# Patient Record
Sex: Female | Born: 1945 | Race: White | Hispanic: No | Marital: Married | Smoking: Never smoker
Health system: Southern US, Community
[De-identification: ages and names within clinical notes are randomized; demographics above are authoritative.]

## PROBLEM LIST (undated history)

## (undated) DIAGNOSIS — M722 Plantar fascial fibromatosis: Secondary | ICD-10-CM

## (undated) DIAGNOSIS — D369 Benign neoplasm, unspecified site: Secondary | ICD-10-CM

## (undated) DIAGNOSIS — R142 Eructation: Secondary | ICD-10-CM

## (undated) DIAGNOSIS — R030 Elevated blood-pressure reading, without diagnosis of hypertension: Secondary | ICD-10-CM

## (undated) DIAGNOSIS — R7301 Impaired fasting glucose: Secondary | ICD-10-CM

## (undated) DIAGNOSIS — IMO0002 Reserved for concepts with insufficient information to code with codable children: Secondary | ICD-10-CM

## (undated) DIAGNOSIS — I10 Essential (primary) hypertension: Secondary | ICD-10-CM

## (undated) DIAGNOSIS — M545 Low back pain, unspecified: Secondary | ICD-10-CM

## (undated) DIAGNOSIS — R000 Tachycardia, unspecified: Secondary | ICD-10-CM

## (undated) DIAGNOSIS — R0982 Postnasal drip: Secondary | ICD-10-CM

## (undated) DIAGNOSIS — Z86010 Personal history of colonic polyps: Secondary | ICD-10-CM

## (undated) HISTORY — DX: Eructation: R14.2

## (undated) HISTORY — DX: Postnasal drip: R09.82

## (undated) HISTORY — DX: Impaired fasting glucose: R73.01

## (undated) HISTORY — DX: Plantar fascial fibromatosis: M72.2

## (undated) HISTORY — DX: Tachycardia, unspecified: R00.0

## (undated) HISTORY — DX: Low back pain: M54.5

## (undated) HISTORY — DX: Benign neoplasm, unspecified site: D36.9

## (undated) HISTORY — DX: Low back pain, unspecified: M54.50

## (undated) HISTORY — DX: Essential (primary) hypertension: I10

## (undated) HISTORY — DX: Elevated blood-pressure reading, without diagnosis of hypertension: R03.0

## (undated) HISTORY — PX: NASAL SEPTUM SURGERY: SHX37

## (undated) HISTORY — DX: Personal history of colonic polyps: Z86.010

## (undated) HISTORY — DX: Reserved for concepts with insufficient information to code with codable children: IMO0002

---

## 1998-12-19 ENCOUNTER — Other Ambulatory Visit: Admission: RE | Admit: 1998-12-19 | Discharge: 1998-12-19 | Payer: Self-pay | Admitting: Obstetrics and Gynecology

## 1999-06-26 ENCOUNTER — Encounter: Admission: RE | Admit: 1999-06-26 | Discharge: 1999-06-26 | Payer: Self-pay | Admitting: Family Medicine

## 1999-06-26 ENCOUNTER — Encounter: Payer: Self-pay | Admitting: Family Medicine

## 1999-12-10 ENCOUNTER — Encounter: Admission: RE | Admit: 1999-12-10 | Discharge: 1999-12-10 | Payer: Self-pay | Admitting: Gynecology

## 1999-12-10 ENCOUNTER — Encounter: Payer: Self-pay | Admitting: Gynecology

## 2000-01-30 ENCOUNTER — Other Ambulatory Visit: Admission: RE | Admit: 2000-01-30 | Discharge: 2000-01-30 | Payer: Self-pay | Admitting: Gynecology

## 2000-08-27 ENCOUNTER — Other Ambulatory Visit: Admission: RE | Admit: 2000-08-27 | Discharge: 2000-08-27 | Payer: Self-pay | Admitting: Gynecology

## 2000-08-27 ENCOUNTER — Encounter (INDEPENDENT_AMBULATORY_CARE_PROVIDER_SITE_OTHER): Payer: Self-pay

## 2000-12-09 ENCOUNTER — Encounter: Admission: RE | Admit: 2000-12-09 | Discharge: 2000-12-09 | Payer: Self-pay | Admitting: Gynecology

## 2000-12-09 ENCOUNTER — Encounter: Payer: Self-pay | Admitting: Gynecology

## 2001-01-02 ENCOUNTER — Encounter: Payer: Self-pay | Admitting: Family Medicine

## 2001-01-02 ENCOUNTER — Encounter: Admission: RE | Admit: 2001-01-02 | Discharge: 2001-01-02 | Payer: Self-pay | Admitting: Family Medicine

## 2001-01-12 ENCOUNTER — Other Ambulatory Visit: Admission: RE | Admit: 2001-01-12 | Discharge: 2001-01-12 | Payer: Self-pay | Admitting: Gynecology

## 2001-05-21 ENCOUNTER — Encounter: Payer: Self-pay | Admitting: Interventional Cardiology

## 2001-05-21 ENCOUNTER — Inpatient Hospital Stay (HOSPITAL_COMMUNITY): Admission: EM | Admit: 2001-05-21 | Discharge: 2001-05-22 | Payer: Self-pay | Admitting: *Deleted

## 2001-05-22 ENCOUNTER — Encounter: Payer: Self-pay | Admitting: Interventional Cardiology

## 2001-12-11 ENCOUNTER — Encounter: Payer: Self-pay | Admitting: Family Medicine

## 2001-12-11 ENCOUNTER — Encounter: Admission: RE | Admit: 2001-12-11 | Discharge: 2001-12-11 | Payer: Self-pay | Admitting: Family Medicine

## 2002-11-03 ENCOUNTER — Other Ambulatory Visit: Admission: RE | Admit: 2002-11-03 | Discharge: 2002-11-03 | Payer: Self-pay | Admitting: Gynecology

## 2002-12-13 ENCOUNTER — Encounter: Payer: Self-pay | Admitting: Family Medicine

## 2002-12-13 ENCOUNTER — Encounter: Admission: RE | Admit: 2002-12-13 | Discharge: 2002-12-13 | Payer: Self-pay | Admitting: Family Medicine

## 2003-01-05 ENCOUNTER — Other Ambulatory Visit: Admission: RE | Admit: 2003-01-05 | Discharge: 2003-01-05 | Payer: Self-pay | Admitting: Gynecology

## 2003-12-14 ENCOUNTER — Encounter: Admission: RE | Admit: 2003-12-14 | Discharge: 2003-12-14 | Payer: Self-pay | Admitting: Gynecology

## 2004-01-03 ENCOUNTER — Other Ambulatory Visit: Admission: RE | Admit: 2004-01-03 | Discharge: 2004-01-03 | Payer: Self-pay | Admitting: Gynecology

## 2005-02-01 ENCOUNTER — Other Ambulatory Visit: Admission: RE | Admit: 2005-02-01 | Discharge: 2005-02-01 | Payer: Self-pay | Admitting: Gynecology

## 2005-02-18 ENCOUNTER — Encounter: Admission: RE | Admit: 2005-02-18 | Discharge: 2005-02-18 | Payer: Self-pay | Admitting: Gynecology

## 2006-02-19 ENCOUNTER — Other Ambulatory Visit: Admission: RE | Admit: 2006-02-19 | Discharge: 2006-02-19 | Payer: Self-pay | Admitting: Gynecology

## 2006-02-20 ENCOUNTER — Encounter: Admission: RE | Admit: 2006-02-20 | Discharge: 2006-02-20 | Payer: Self-pay | Admitting: Gynecology

## 2007-02-27 ENCOUNTER — Other Ambulatory Visit: Admission: RE | Admit: 2007-02-27 | Discharge: 2007-02-27 | Payer: Self-pay | Admitting: Gynecology

## 2007-03-11 ENCOUNTER — Encounter: Admission: RE | Admit: 2007-03-11 | Discharge: 2007-03-11 | Payer: Self-pay | Admitting: Family Medicine

## 2008-01-14 ENCOUNTER — Ambulatory Visit: Payer: Self-pay | Admitting: Gynecology

## 2008-03-14 ENCOUNTER — Ambulatory Visit: Payer: Self-pay | Admitting: Gynecology

## 2008-03-14 ENCOUNTER — Other Ambulatory Visit: Admission: RE | Admit: 2008-03-14 | Discharge: 2008-03-14 | Payer: Self-pay | Admitting: Gynecology

## 2008-03-15 ENCOUNTER — Encounter: Admission: RE | Admit: 2008-03-15 | Discharge: 2008-03-15 | Payer: Self-pay | Admitting: Family Medicine

## 2008-07-26 ENCOUNTER — Ambulatory Visit: Payer: Self-pay | Admitting: Gynecology

## 2009-03-30 ENCOUNTER — Encounter: Admission: RE | Admit: 2009-03-30 | Discharge: 2009-03-30 | Payer: Self-pay | Admitting: Gynecology

## 2009-03-30 IMAGING — MG MM DIGITAL SCREENING
4 series · 4 of 4 positions shown · non-contrast
Comparison: none

DG SCREEN MAMMOGRAM BILATERAL
Bilateral CC and MLO view(s) were taken.

DIGITAL SCREENING MAMMOGRAM WITH CAD:
The breast tissue is heterogeneously dense.  No masses or malignant type calcifications are 
identified.  Compared with prior studies.
Images were processed with CAD.

[R CC]
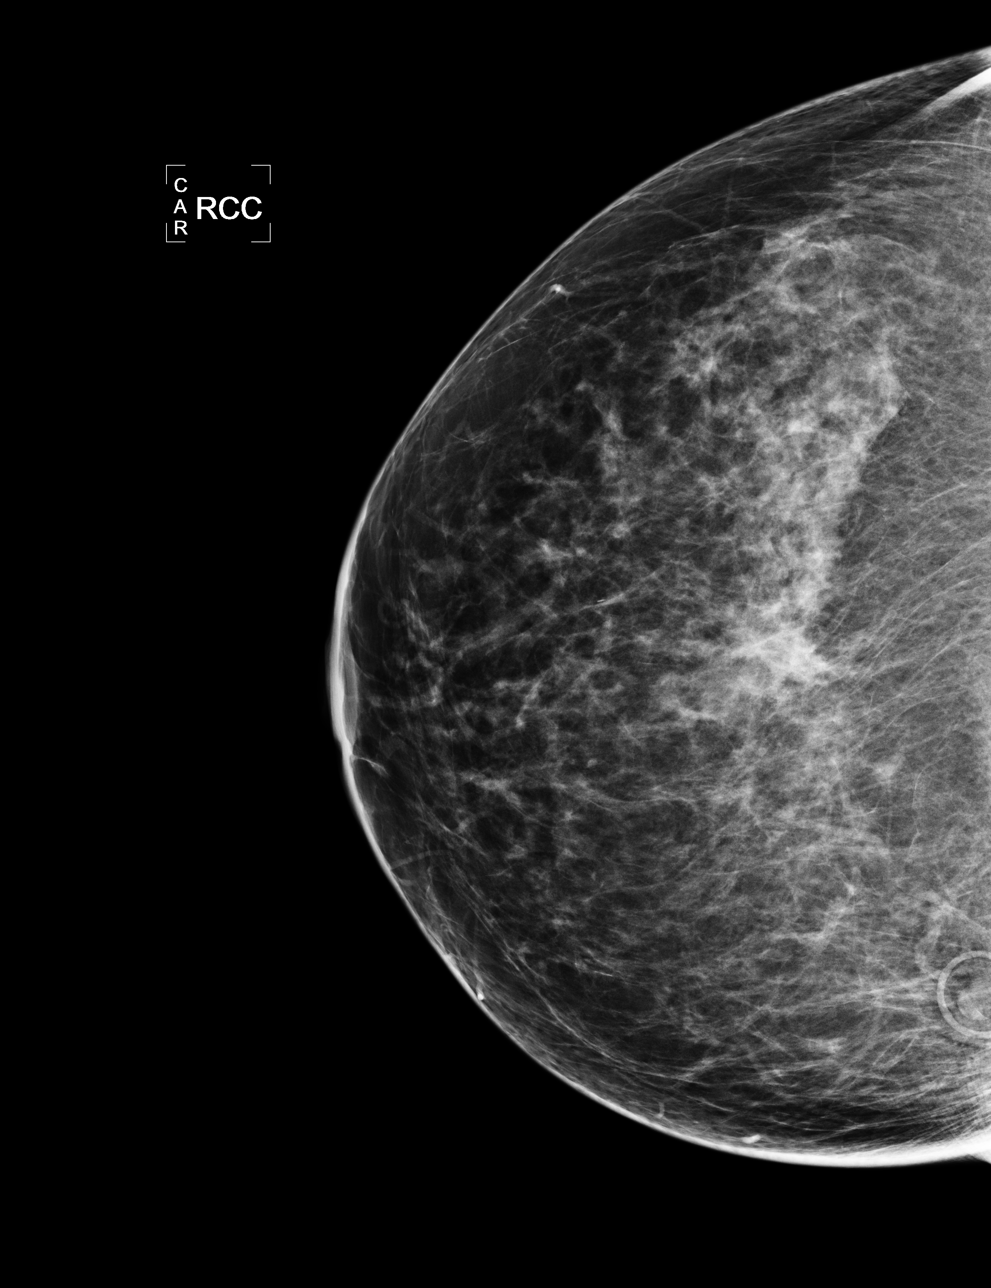

[L CC]
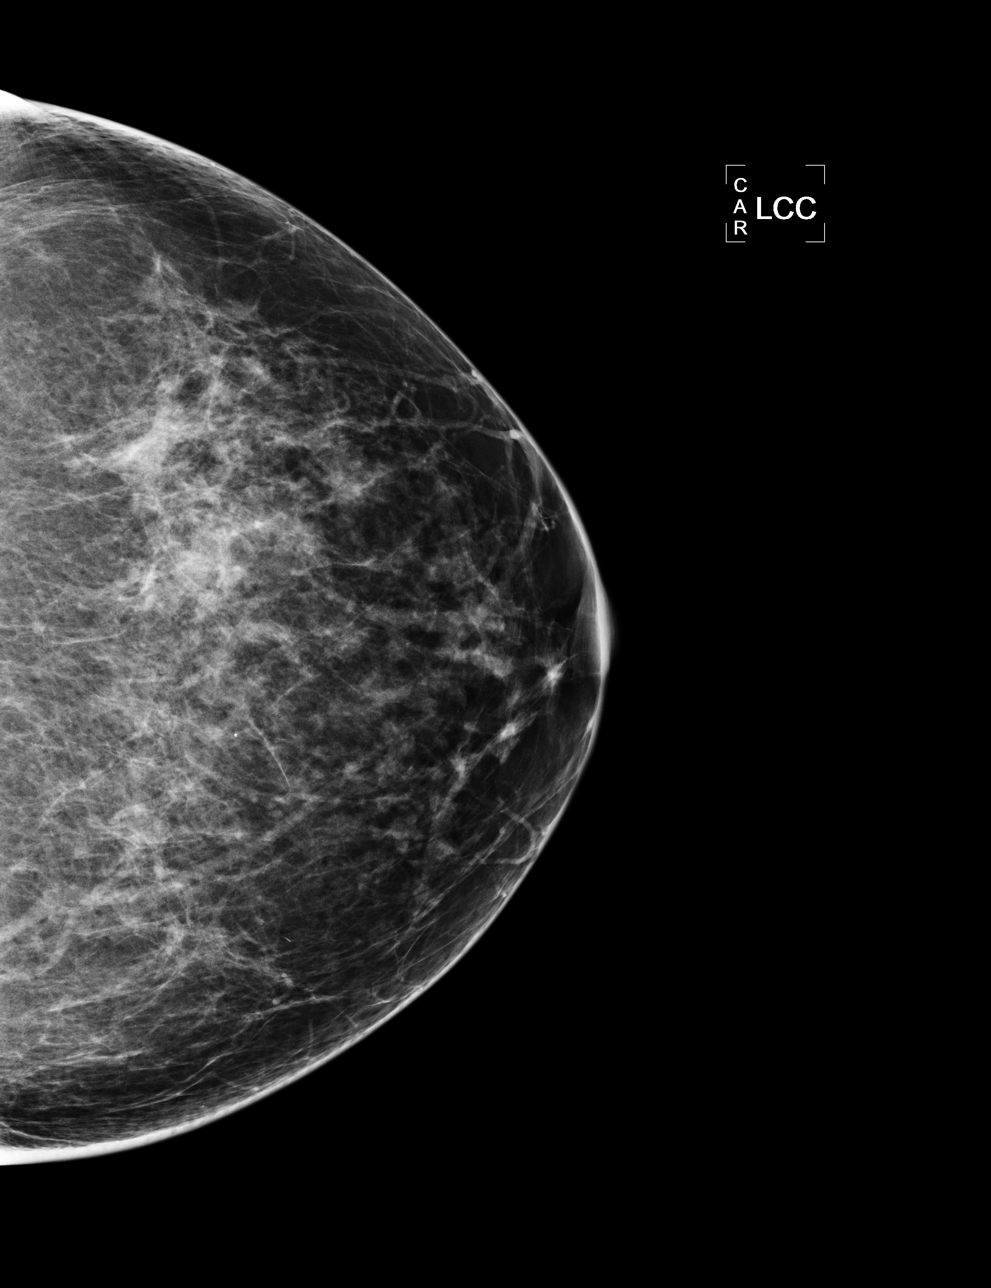

[L MLO]
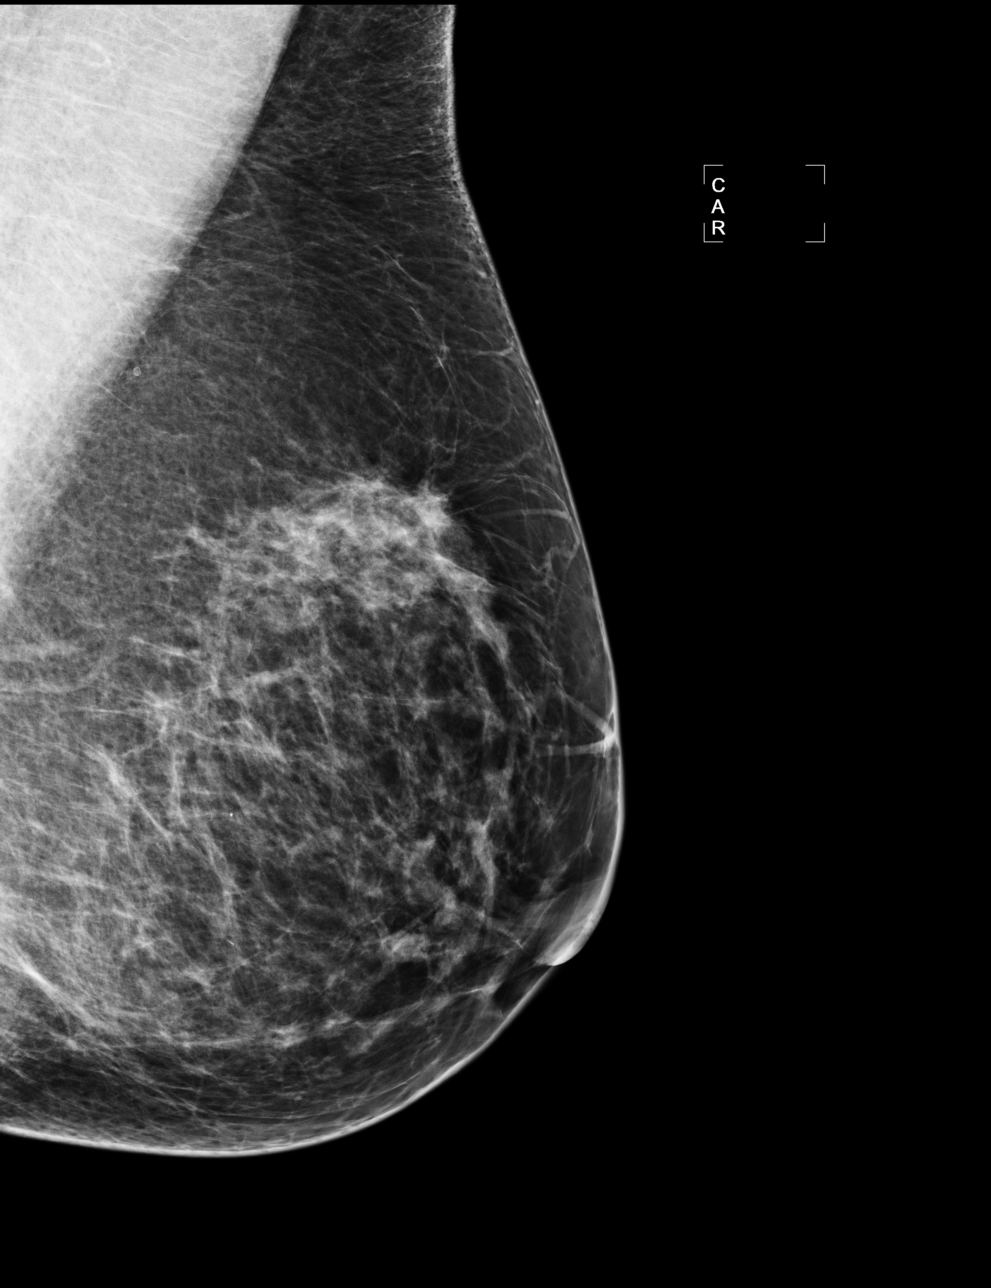

[R MLO]
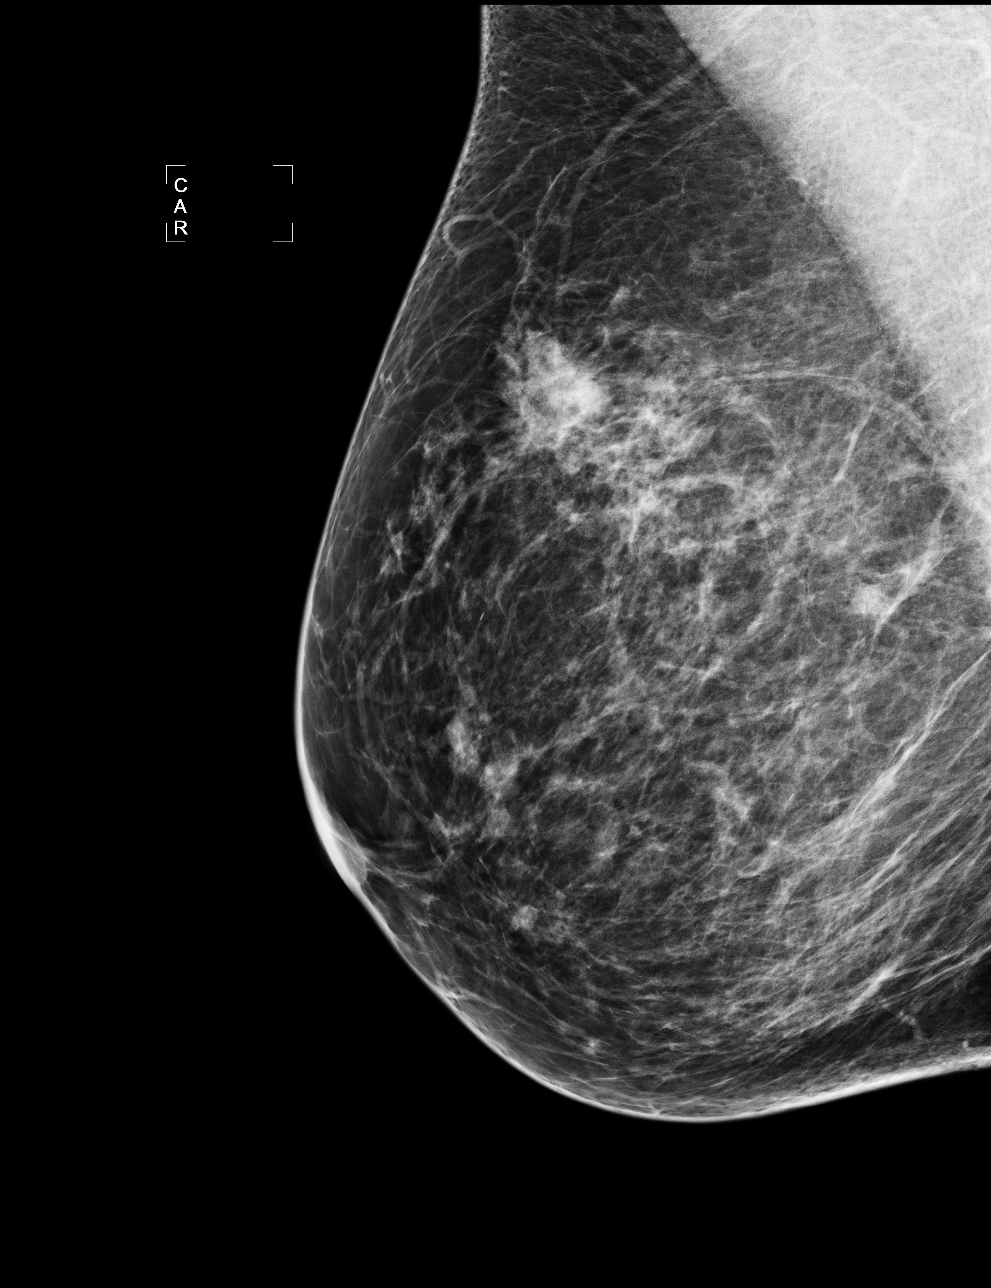

[4 of 4 positions shown; findings below may reference images not displayed]

IMPRESSION: No specific mammographic evidence of malignancy.  Next screening mammogram is recommended in one 
year.

A result letter of this screening mammogram will be mailed directly to the patient.

ASSESSMENT: Negative - BI-RADS 1

Screening mammogram in 1 year.
,

## 2010-04-02 ENCOUNTER — Encounter
Admission: RE | Admit: 2010-04-02 | Discharge: 2010-04-02 | Payer: Self-pay | Source: Home / Self Care | Attending: Family Medicine | Admitting: Family Medicine

## 2010-06-11 ENCOUNTER — Encounter (INDEPENDENT_AMBULATORY_CARE_PROVIDER_SITE_OTHER): Payer: Medicare Other | Admitting: Gynecology

## 2010-06-11 ENCOUNTER — Other Ambulatory Visit (HOSPITAL_COMMUNITY)
Admission: RE | Admit: 2010-06-11 | Discharge: 2010-06-11 | Disposition: A | Discharge status: Home / Self Care | Payer: Medicare Other | Source: Ambulatory Visit | Attending: Gynecology | Admitting: Gynecology

## 2010-06-11 ENCOUNTER — Other Ambulatory Visit: Payer: Self-pay | Admitting: Gynecology

## 2010-06-11 DIAGNOSIS — N952 Postmenopausal atrophic vaginitis: Secondary | ICD-10-CM

## 2010-06-11 DIAGNOSIS — N3941 Urge incontinence: Secondary | ICD-10-CM

## 2010-06-11 DIAGNOSIS — Z124 Encounter for screening for malignant neoplasm of cervix: Secondary | ICD-10-CM | POA: Insufficient documentation

## 2010-06-11 DIAGNOSIS — N951 Menopausal and female climacteric states: Secondary | ICD-10-CM

## 2010-08-10 NOTE — H&P (Signed)
[place]. [facility]  Patient:    Amy [name], Amy [name] Visit Number: [id number] MRN: [id number]          Service Type: MED Location: [id number] Attending Physician:  Corlis Leak. Dictated by:Anselm Lis, N.P. Admit Date:  05/21/2001   CC:         Chales Salmon. Abigail Miyamoto, M.D.                         History and Physical  PRIMARY CARE Kimberley Dastrup:  Chales Salmon. Abigail Miyamoto, M.D. DATE OF BIRTH:  April 26, 1945.  IMPRESSION (as dictated byDr. Verdis Prime): 1. Lower substernal chest pressure, question angina versus gastroesophageal    reflux disease in this 65 year old female with positive family history of    coronary artery disease (late in life), unknown cholesterol profile. 2. Resting tachycardia; heart rate 90s consistently.  PLAN (as dictated by Dr. Verdis Prime): 1. Admit to telemetry onrule out MI protocol, serial cardiac enzymes, daily    EKG. 2. Enteric-coated aspirin q.d., p.r.n. sublingual nitrates. 3. Protonix. 4. If rules out, stress Cardiolite in the morning looking for evidence of    myocardial ischemia.  If rules in, would proceed with cardiac    catheterization with addition of Plavix and/or IIb/IIIa inhibitors as well    as Lovenox or heparin.  IV nitrates if recurrent chest pain. 5. Monitor heart rateovernight.  If she continues tachycardic, might benefit    from beta blocker. 6. Check TSH.  HISTORY OF PRESENT ILLNESS:  Ms. Amy [name] is a very pleasant 65 year old female with a positive family history for late in life CAD in a patient with unknown cholesterol profile.  Last evening about 10 oclock she had left elbow circumferential discomfort, which was mild.  She went to sleep with the same discomfort.  In the morning she woke with continued left elbow discomfort and later developed left shoulder discomfort, which lasted about 30 minutes.  No associated symptoms such as nausea, diaphoresis, or shortness of breath.  She took some  Goodys Powder with resolution of elbow and shoulder discomfort.  She had drunk a Pepsi and a few crackers earlier.  She then developed lower substernal chest pressure, no associated symptoms nor recurrence of arm and shoulder pain.  She presented to primary M.D. office, where EKG was nonischemic.  EMS was summoned.  She was given one sublingual nitrate, which tended to ease the pressure.  She is currently discomfort-free.  Follow-up EKG revealed some T-wave inversion in lead III here in the ER with small Q-wave.  Question unstable angina versus GERD.  PAST MEDICAL HISTORY: 1. Problems with flatus. 2. Urinary stress incontinence.  She is due tosee Dr. Aldean Ast in follow-up    of this. 3. History of repair of deviated nasal septum. 4. Degenerative disk disease, lower back, for which she takes daily Vioxx.  She denies history of diabetes mellitus, cancer, asthma, nor thyroid disease.  ALLERGIES:  No known drug allergies.She broke out at one time when eating scallops but has since eaten them without problems.  MEDICATIONS:  Vioxx once daily, multivitamin a day,she takes Women of Soy products for menopausal symptoms with some relief, also takes B complex vitamin as well.  SOCIAL HISTORY:  Tobacco:  Negative.  ETOH:  Negative.  She works a very stressful job in [employer].  She is married for 37 years, has two daughters, ages 21 and 66, alive and well, one of whom is a  pharmacist.  FAMILY HISTORY:  Herfather had bypass surgery at age 47s.  He is currently 71.  Mother had history of cardiac catheterization in her 44s, for which she is undergoingmedical therapy.  She is also 83.  The patient has one brother who is alive and well.  REVIEW OF SYSTEMS:  She wears bifocals.  Hearing okay.  Negative dysphagia of food or fluids.  Negative melena, no bright red blood p.r.  No constipation or diarrhea.  She suffers from stress incontinence, for which she plans to see Dr. Aldean Ast.  She  feels palpitations of stress.  Lower back pains, for which she takes Vioxx for degenerative disk disease.  Negative pedal edema, orthopnea, PND, nor DOE.  She is fairly sedentary.  PHYSICAL EXAMINATION (as performed by Dr. Verdis Prime):  VITAL SIGNS:  Blood pressure is 153/71, heart rate 96 and regular, respiratory rate 16.  Her temperature is 98 degrees.  GENERAL:She is a well-nourished, slightly anxious middle-aged female in no acutedistress.  Her husband and daughter as well as a little granddaughter are in attendance.  NECK:  Brisk bilateral carotid upstroke without bruit.  Negative for any JVD. No thyromegaly.  CARDIAC:  Regular rateand rhythm without murmur, rub, nor gallop. Normal S1 and S2.  CHEST:  Clear.  No CPA tenderness.  ABDOMEN:  Soft, nondistended, normoactive bowel sounds.  Negative abdominal aortic, inguinal, or femoral bruit.  No masses nor organomegaly to applied pressure.  Nontender.  EXTREMITIES:  Distal pulses intact.  Negative pedal edema.  NEUROLOGIC:  Cranial nerves II-XII grossly intact, alert and oriented x 3.  GENITAL/RECTAL:  Deferred.  LABORATORY DATA:  CBC, CMET, coagulation studies, TSH, cardiac enzymes are drawn and are pending.  EKG reveals NSR at 96 beats per minute, T-wave inversion in lead III.  No ischemic ST changes.  Chest x-ray revealed no active disease. Dictated by:   MaryP. Serpe, N.P. Attending Physician:  Corlis Leak DD:  05/21/01 TD:  05/21/01 Job: [id number]

## 2011-01-17 ENCOUNTER — Emergency Department (HOSPITAL_COMMUNITY): Payer: Medicare Other

## 2011-01-17 ENCOUNTER — Observation Stay (HOSPITAL_COMMUNITY)
Admission: EM | Admit: 2011-01-17 | Discharge: 2011-01-18 | Disposition: A | Discharge status: Home / Self Care | Payer: Medicare Other | Attending: Internal Medicine | Admitting: Internal Medicine

## 2011-01-17 DIAGNOSIS — R0602 Shortness of breath: Secondary | ICD-10-CM | POA: Insufficient documentation

## 2011-01-17 DIAGNOSIS — R0989 Other specified symptoms and signs involving the circulatory and respiratory systems: Secondary | ICD-10-CM | POA: Insufficient documentation

## 2011-01-17 DIAGNOSIS — R0609 Other forms of dyspnea: Secondary | ICD-10-CM | POA: Insufficient documentation

## 2011-01-17 DIAGNOSIS — Z8249 Family history of ischemic heart disease and other diseases of the circulatory system: Secondary | ICD-10-CM | POA: Insufficient documentation

## 2011-01-17 DIAGNOSIS — R079 Chest pain, unspecified: Principal | ICD-10-CM | POA: Insufficient documentation

## 2011-01-17 LAB — DIFFERENTIAL
Eosinophils Absolute: 0 10*3/uL (ref 0.0–0.7)
Lymphocytes Relative: 32 % (ref 12–46)
Lymphs Abs: 1.4 10*3/uL (ref 0.7–4.0)
Monocytes Relative: 10 % (ref 3–12)
Neutrophils Relative %: 56 % (ref 43–77)

## 2011-01-17 LAB — COMPREHENSIVE METABOLIC PANEL
AST: 24 U/L (ref 0–37)
BUN: 15 mg/dL (ref 6–23)
CO2: 28 mEq/L (ref 19–32)
Calcium: 9.8 mg/dL (ref 8.4–10.5)
Chloride: 104 mEq/L (ref 96–112)
Creatinine, Ser: 0.71 mg/dL (ref 0.50–1.10)
GFR calc Af Amer: >90 mL/min (ref >90)
GFR calc non Af Amer: 89 mL/min — ABNORMAL LOW (ref >90)
Glucose, Bld: 96 mg/dL (ref 70–99)
Total Bilirubin: 0.4 mg/dL (ref 0.3–1.2)

## 2011-01-17 LAB — POCT I-STAT TROPONIN I: Troponin i, poc: 0 ng/mL (ref 0.00–0.08)

## 2011-01-17 LAB — CBC
HCT: 38.4 % (ref 36.0–46.0)
MCH: 33.3 pg (ref 26.0–34.0)
MCV: 96 fL (ref 78.0–100.0)
Platelets: 241 10*3/uL (ref 150–400)
RBC: 4 MIL/uL (ref 3.87–5.11)
WBC: 4.4 10*3/uL (ref 4.0–10.5)

## 2011-01-17 LAB — PRO B NATRIURETIC PEPTIDE: Pro B Natriuretic peptide (BNP): 53.1 pg/mL (ref 0–125)

## 2011-01-18 LAB — CARDIAC PANEL(CRET KIN+CKTOT+MB+TROPI)
CK, MB: 2.5 ng/mL (ref 0.3–4.0)
CK, MB: 2.4 ng/mL (ref 0.3–4.0)
Relative Index: INVALID (ref 0.0–2.5)
Total CK: 79 U/L (ref 7–177)
Troponin I: <0.3 ng/mL (ref <0.30)
Troponin I: <0.3 ng/mL (ref <0.30)

## 2011-01-20 NOTE — H&P (Signed)
[name], [name] NO.:  [id number]  MEDICAL RECORD NO.:  [id number]  LOCATION:  3741                         FACILITY:  [facility]  PHYSICIAN:  Amy Ruiz, MD    DATE OF BIRTH:  Sep 13, 1945  DATE OF ADMISSION:  01/17/2011 DATE OF DISCHARGE:                             HISTORY & PHYSICAL   PRIMARY CARE PHYSICIAN:  Amy Drown, MD, at [facility].  CHIEF COMPLAINT:  Chest pain.  HISTORY OF PRESENT ILLNESS:  The patient is a 65 year old female, healthy presenting to the emergency department with chest pain that occurred while she was starting her walking routine these this morning. The patient felt retrosternal chest pressure associated to shortness of breath, and decided to stop and come to the ED.  There was no radiation of pain and no diaphoresis, nausea, or vomiting.  She denied lightheadedness, dizziness, or syncope.  Yesterday, after she finished raking the leaves in the yard, she noticed that she became pretty short of breath when she was climbing up a hill.  She did not have chest pain at that time.  By the time, she came to the emergency department, she was chest pain free.  In the emergency department, she has undergone an evaluation that included cardiac enzymes, EKG, and chest x-ray essentially negative.  The patient stated that she had a cardiac evaluation about 10 years ago when she developed retrosternal chest pain and underwent EKGs and blood work and a stress test and was told she had reflux.  At this time, she did not experience reflux or heartburn, however felt the need to belch.  PAST MEDICAL HISTORY: 1. Sciatica. 2. Deviated septum surgically repaired in 1973. 3. Stress test normal 10 years ago performed by Dr. Katrinka [name].  FAMILY HISTORY:  Mother had ischemic heart disease and underwent cardiac catheterization.  Father had ischemic heart disease and underwent CABG in his 44s.  They both deceased in their  31s.  MEDICATIONS:  Aspirin 81 mg a day, calcium, vitamin D, and vitamin B.  ALLERGIES:  No known drug allergies.  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  She denies tobacco, alcohol, or illicits.  She lives with her husband.  They had 2 grown up daughters.  She walks daily 45 minutes to an hour.  REVIEW OF SYSTEMS:  CONSTITUTIONAL:  No fever, chills, night sweats, fatigue, malaise, or weight loss.  CARDIOVASCULAR:  No orthopnea, nocturia, or PND.  No edema.  RESPIRATORY:  No wheezes, no cough, no history of asthma.  GI: Denies heartburn, nausea, diarrhea, or constipation.  GU:  Denies dysuria, frequency, or hematuria. NEUROLOGICAL:  No focal weakness, numbness, or paresthesias.  PHYSICAL EXAMINATION:  VITAL SIGNS:  Temperature 97.3, pulse 89, respirations 15, blood pressure 158/89. GENERAL APPEARANCE:  The patient is a healthy-looking Caucasian woman, pleasant and cooperative. HEENT:  PERRLA, EOMI, unremarkable otherwise. NECK:  Supple.  No JVD.  No carotid bruits.  Chest wall normal.  No tenderness on palpation. HEART:  Regular S1 and S2 without gallops, murmurs, or rubs. LUNGS:  Clear to auscultation. ABDOMEN:  Soft, nontender,  without organomegaly or masses palpable. EXTREMITIES:  With no clubbing, cyanosis, or edema. NEUROLOGICAL:  Awake, alert, and oriented x3.  Cranial nerves II-XII intact. MUSCULOSKELETAL:  Motor strength 5/5 in the upper and lower extremities. Sensory is intact.  Reflexes is 2+ on the patella.  Babinski negative. Finger-to-nose normal.  EKG normal sinus rhythm at 81 bpm.  No ST or T-wave abnormalities. Chest x-ray, no acute cardiopulmonary findings.  Comprehensive metabolic panel within normal limits.  CBC within normal limits.  Troponin 0.00.  IMPRESSION:  A 65 year old female with a positive family history for ischemic heart disease,  with no prior medical history, presenting with retrosternal chest pain associated to shortness of breath on exertion x1.  Initial evaluation with troponin, EKG, and chest x-ray negative. Given her cardiovascular risk factor profile, we will keep patient for telemetry observation and rule her out with serial cardiac enzymes and morning EKG and echocardiogram.  In the morning, she will be reassessed and decide on the type of a stress test to be performed.  PLAN:  The patient will be admitted to cardiac telemetry for observation.  She will be ruled out for myocardial infarction/ischemia. She will be continued on aspirin 81 mg a day only.  She will not be placed on anticoagulation or beta-blockers given the fact that she is asymptomatic and she is not presenting with acute coronary syndrome. She will have serial cardiac enzymes, and repeat echo in a.m.  She will have an echocardiogram in the morning.  She will be followed by [facility] who will decide on the need for stress test.          ______________________________ Amy Ruiz, MD     GL/MEDQ  D:  01/17/2011  T:  01/17/2011  Job:  [id number]  cc:   Amy [name], M.D.  Electronically Signed by Amy Ruiz MD on 01/20/2011 10:00:47 PM

## 2011-01-25 NOTE — Discharge Summary (Signed)
  [name], [name] NO.:  [id number]  MEDICAL RECORD NO.:  [id number]  LOCATION:  3741                         FACILITY:  [facility]  PHYSICIAN:  Kathlen Mody, MD       DATE OF BIRTH:  December 12, 1945  DATE OF ADMISSION:  01/17/2011 DATE OF DISCHARGE:  01/18/2011                              DISCHARGE SUMMARY   DISCHARGE DIAGNOSIS:  Atypical chest pain, most likely gastroesophageal reflux disease.  OTHER DIAGNOSIS:  Sciatica.  DISCHARGE MEDICATIONS: 1. Maalox 30 mL every 6 hours as needed. 2. Protonix 40 mg 2 tablets daily. 3. Aspirin 81 mg daily. 4. Calcium carbonate 1 tablet daily. 5. Multivitamin 1 tablet daily. 6. Vitamin B complex 1 tablet daily. 7. Vitamin D 3000 units 1 tablet daily.   Consultations over the phone, Cardiology consult from Dr. Eldridge Dace.  PROCEDURES:  The patient had a 2D echocardiogram which showed good ventricular systolic function with good ejection fraction.  PERTINENT LABORATORY FINDINGS:  The patient had a CBC done which was within normal limits.  Comprehensive metabolic panel which was within normal limits.  Cardiac enzymes were negative x3.  ProBNP was 53.  DIAGNOSTIC STUDIES:  The patient had a 2-view chest x-ray which showed no acute cardiopulmonary findings.  The patient had an echocardiogram done which showed an ejection fraction of 55-60% low regional wall abnormalities.  BRIEF HOSPITAL COURSE:  This is a 65 year old lade came into the ER due to atypical chest pain, chills admitted to tele.  Acute coronary syndrome was ruled out.  She had normal sinus rhythm on the EKG. Enzymes were negative.  Echocardiogram was within normal limits. Cardiology consult over the phone was obtained from Dr. Eldridge Dace and the plan was made to do an outpatient stress test on discharge.  On the day of discharge, the patient's vitals were within normal limits.  Her exam was within normal limits.  Her chest was completely resolved.  She was  discharged home on Protonix.          ______________________________ Kathlen Mody, MD     VA/MEDQ  D:  01/24/2011  T:  01/24/2011  Job:  [id number]  Electronically Signed by Kathlen Mody MD on 01/25/2011 11:02:43 PM

## 2011-03-22 ENCOUNTER — Other Ambulatory Visit: Payer: Self-pay | Admitting: Family Medicine

## 2011-03-22 DIAGNOSIS — Z1231 Encounter for screening mammogram for malignant neoplasm of breast: Secondary | ICD-10-CM

## 2011-04-04 ENCOUNTER — Ambulatory Visit
Admission: RE | Admit: 2011-04-04 | Discharge: 2011-04-04 | Disposition: A | Discharge status: Home / Self Care | Payer: Medicare Other | Source: Ambulatory Visit | Attending: Family Medicine | Admitting: Family Medicine

## 2011-04-04 DIAGNOSIS — Z1231 Encounter for screening mammogram for malignant neoplasm of breast: Secondary | ICD-10-CM

## 2011-05-28 DIAGNOSIS — D369 Benign neoplasm, unspecified site: Secondary | ICD-10-CM

## 2011-05-28 DIAGNOSIS — Z860100 Personal history of colon polyps, unspecified: Secondary | ICD-10-CM

## 2011-05-28 HISTORY — DX: Personal history of colon polyps, unspecified: Z86.0100

## 2011-05-28 HISTORY — DX: Benign neoplasm, unspecified site: D36.9

## 2011-05-28 HISTORY — DX: Personal history of colonic polyps: Z86.010

## 2011-05-29 ENCOUNTER — Other Ambulatory Visit: Payer: Self-pay | Admitting: Gastroenterology

## 2012-03-20 ENCOUNTER — Other Ambulatory Visit: Payer: Self-pay | Admitting: Family Medicine

## 2012-03-20 DIAGNOSIS — Z1231 Encounter for screening mammogram for malignant neoplasm of breast: Secondary | ICD-10-CM

## 2012-04-22 ENCOUNTER — Ambulatory Visit: Payer: Medicare Other

## 2012-04-24 ENCOUNTER — Ambulatory Visit
Admission: RE | Admit: 2012-04-24 | Discharge: 2012-04-24 | Disposition: A | Discharge status: Home / Self Care | Payer: Medicare Other | Source: Ambulatory Visit | Attending: Family Medicine | Admitting: Family Medicine

## 2012-04-24 DIAGNOSIS — Z1231 Encounter for screening mammogram for malignant neoplasm of breast: Secondary | ICD-10-CM

## 2013-04-16 ENCOUNTER — Other Ambulatory Visit: Payer: Self-pay

## 2013-04-16 DIAGNOSIS — Z1231 Encounter for screening mammogram for malignant neoplasm of breast: Secondary | ICD-10-CM

## 2013-05-12 ENCOUNTER — Ambulatory Visit: Payer: Medicare Other

## 2013-05-12 ENCOUNTER — Ambulatory Visit
Admission: RE | Admit: 2013-05-12 | Discharge: 2013-05-12 | Disposition: A | Discharge status: Home / Self Care | Payer: Medicare Other | Source: Ambulatory Visit

## 2013-05-12 DIAGNOSIS — Z1231 Encounter for screening mammogram for malignant neoplasm of breast: Secondary | ICD-10-CM

## 2014-02-22 ENCOUNTER — Telehealth: Payer: Self-pay | Admitting: *Deleted

## 2014-02-22 NOTE — Telephone Encounter (Signed)
It would be okay to schedule here.

## 2014-02-22 NOTE — Telephone Encounter (Signed)
Pt has annual scheduled on 04/12/14 last seen in 2012 here. Pt has dexa order from other MD and asked if dexa could be done here prior to annual with you? I explained to pt unsure if you would okay this since. Please advise

## 2014-02-22 NOTE — Telephone Encounter (Signed)
Butch Penny informed, pt will be scheduled.

## 2014-04-12 ENCOUNTER — Encounter: Payer: Self-pay | Admitting: Gynecology

## 2014-04-12 ENCOUNTER — Ambulatory Visit (INDEPENDENT_AMBULATORY_CARE_PROVIDER_SITE_OTHER): Payer: Medicare Other | Admitting: Gynecology

## 2014-04-12 ENCOUNTER — Ambulatory Visit: Payer: Self-pay | Admitting: Gynecology

## 2014-04-12 ENCOUNTER — Other Ambulatory Visit (HOSPITAL_COMMUNITY)
Admission: RE | Admit: 2014-04-12 | Discharge: 2014-04-12 | Disposition: A | Discharge status: Home / Self Care | Payer: Medicare Other | Source: Ambulatory Visit | Attending: Gynecology | Admitting: Gynecology

## 2014-04-12 VITALS — BP 150/90 | Ht 65.0 in | Wt 175.0 lb

## 2014-04-12 DIAGNOSIS — N952 Postmenopausal atrophic vaginitis: Secondary | ICD-10-CM

## 2014-04-12 DIAGNOSIS — Z124 Encounter for screening for malignant neoplasm of cervix: Secondary | ICD-10-CM

## 2014-04-12 DIAGNOSIS — Z13820 Encounter for screening for osteoporosis: Secondary | ICD-10-CM

## 2014-04-12 DIAGNOSIS — N393 Stress incontinence (female) (male): Secondary | ICD-10-CM

## 2014-04-12 DIAGNOSIS — Z01419 Encounter for gynecological examination (general) (routine) without abnormal findings: Secondary | ICD-10-CM

## 2014-04-12 NOTE — Addendum Note (Signed)
Addended by: Nelva Nay on: 04/12/2014 12:58 PM   Modules accepted: Orders

## 2014-04-12 NOTE — Progress Notes (Signed)
Amy [name] [birth year] [id number]        68 y.o.  G2P2 for breast exam and pelvic. Has not been in for several years. Several issues noted below.  Past medical history,surgical history, problem list, medications, allergies, family history and social history were all reviewed and documented as reviewed in the EPIC chart.  ROS:  Performed with pertinent positives and negatives included in the history, assessment and plan.   Additional significant findings :  none   Exam: [name] Vitals:   04/12/14 1223  BP: 150/90  Height: 5\' 5"  (1.651 m)  Weight: 175 lb (79.379 kg)   General appearance:  Normal affect, orientation and appearance. Skin: Grossly normal HEENT: Without gross lesions.  No cervical or supraclavicular adenopathy. Thyroid normal.  Lungs:  Clear without wheezing, rales or rhonchi Cardiac: RR, without RMG Abdominal:  Soft, nontender, without masses, guarding, rebound, organomegaly or hernia Breasts:  Examined lying and sitting without masses, retractions, discharge or axillary adenopathy. Pelvic:  Ext/BUS/vagina with generalized atrophic changes.  Cervix with atrophic changes. Pap smear done  Uterus anteverted the axial, normal size, shape and contour, midline and mobile nontender   Adnexa  Without masses or tenderness    Anus and perineum  Normal   Rectovaginal  Normal sphincter tone without palpated masses or tenderness.    Assessment/Plan:  69 y.o. G2P2 female for breast and pelvic exam.   1. Postmenopausal/atrophic genital changes.  Without significant symptoms of hot flashes, night sweats, vaginal dryness or dyspareunia. No vaginal bleeding. Continue to monitor. Report any vaginal bleeding. 2. SUI symptoms. Patient with classic loss of urine with coughing, sneezing, bearing down. No UTI symptoms such as frequency, dysuria or urgency. Check baseline urinalysis. Options to include Kegel exercises, OTC products and surgery reviewed. Patient is not  interested in surgery but prefers to monitor present. Will call if she wants to be referred for consideration of surgery. 3. Pap smear 2012. Pap smear done today. Options to stop screening as she is over the age of 48 without a history of significant abnormal Pap smears reviewed. Patient's uncomfortable with this and I did a Pap smear today. 4. Mammography coming due next month. I reminded patient to schedule this. SBE monthly reviewed. 5. DEXA 2010 normal. Her orthopedic surgeon recommended getting a baseline DEXA now. She is 5 years out and will go ahead and schedule when now. Had recent vitamin D level through her orthopedics office. 6. Colonoscopy 3 years ago. Repeat at their recommended interval. 7. Health maintenance. Elevated blood pressure 150/90 reviewed. She is being followed for hypertension. Will follow up and have it rechecked in a non-exam situation. Follow up with her primary physician if remains elevated.  Follow up for bone density otherwise 1 year for annual exam.     Anastasio Auerbach MD, 12:49 PM 04/12/2014

## 2014-04-12 NOTE — Patient Instructions (Signed)
Follow up for bone density as scheduled.  You may obtain a copy of any labs that were done today by logging onto MyChart as outlined in the instructions provided with your AVS (after visit summary). The office will not call with normal lab results but certainly if there are any significant abnormalities then we will contact you.   Health Maintenance, Female A healthy lifestyle and preventative care can promote health and wellness.  Maintain regular health, dental, and eye exams.  Eat a healthy diet. Foods like vegetables, fruits, whole grains, low-fat dairy products, and lean protein foods contain the nutrients you need without too many calories. Decrease your intake of foods high in solid fats, added sugars, and salt. Get information about a proper diet from your caregiver, if necessary.  Regular physical exercise is one of the most important things you can do for your health. Most adults should get at least 150 minutes of moderate-intensity exercise (any activity that increases your heart rate and causes you to sweat) each week. In addition, most adults need muscle-strengthening exercises on 2 or more days a week.   Maintain a healthy weight. The body mass index (BMI) is a screening tool to identify possible weight problems. It provides an estimate of body fat based on height and weight. Your caregiver can help determine your BMI, and can help you achieve or maintain a healthy weight. For adults 20 years and older:  A BMI below 18.5 is considered underweight.  A BMI of 18.5 to 24.9 is normal.  A BMI of 25 to 29.9 is considered overweight.  A BMI of 30 and above is considered obese.  Maintain normal blood lipids and cholesterol by exercising and minimizing your intake of saturated fat. Eat a balanced diet with plenty of fruits and vegetables. Blood tests for lipids and cholesterol should begin at age 20 and be repeated every 5 years. If your lipid or cholesterol levels are high, you are over  50, or you are a high risk for heart disease, you may need your cholesterol levels checked more frequently.Ongoing high lipid and cholesterol levels should be treated with medicines if diet and exercise are not effective.  If you smoke, find out from your caregiver how to quit. If you do not use tobacco, do not start.  Lung cancer screening is recommended for adults aged 55 80 years who are at high risk for developing lung cancer because of a history of smoking. Yearly low-dose computed tomography (CT) is recommended for people who have at least a 30-pack-year history of smoking and are a current smoker or have quit within the past 15 years. A pack year of smoking is smoking an average of 1 pack of cigarettes a day for 1 year (for example: 1 pack a day for 30 years or 2 packs a day for 15 years). Yearly screening should continue until the smoker has stopped smoking for at least 15 years. Yearly screening should also be stopped for people who develop a health problem that would prevent them from having lung cancer treatment.  If you are pregnant, do not drink alcohol. If you are breastfeeding, be very cautious about drinking alcohol. If you are not pregnant and choose to drink alcohol, do not exceed 1 drink per day. One drink is considered to be 12 ounces (355 mL) of beer, 5 ounces (148 mL) of wine, or 1.5 ounces (44 mL) of liquor.  Avoid use of street drugs. Do not share needles with anyone. Ask for help   if you need support or instructions about stopping the use of drugs.  High blood pressure causes heart disease and increases the risk of stroke. Blood pressure should be checked at least every 1 to 2 years. Ongoing high blood pressure should be treated with medicines, if weight loss and exercise are not effective.  If you are 55 to 70 years old, ask your caregiver if you should take aspirin to prevent strokes.  Diabetes screening involves taking a blood sample to check your fasting blood sugar level.  This should be done once every 3 years, after age 45, if you are within normal weight and without risk factors for diabetes. Testing should be considered at a younger age or be carried out more frequently if you are overweight and have at least 1 risk factor for diabetes.  Breast cancer screening is essential preventative care for women. You should practice "breast self-awareness." This means understanding the normal appearance and feel of your breasts and may include breast self-examination. Any changes detected, no matter how small, should be reported to a caregiver. Women in their 20s and 30s should have a clinical breast exam (CBE) by a caregiver as part of a regular health exam every 1 to 3 years. After age 40, women should have a CBE every year. Starting at age 40, women should consider having a mammogram (breast X-ray) every year. Women who have a family history of breast cancer should talk to their caregiver about genetic screening. Women at a high risk of breast cancer should talk to their caregiver about having an MRI and a mammogram every year.  Breast cancer gene (BRCA)-related cancer risk assessment is recommended for women who have family members with BRCA-related cancers. BRCA-related cancers include breast, ovarian, tubal, and peritoneal cancers. Having family members with these cancers may be associated with an increased risk for harmful changes (mutations) in the breast cancer genes BRCA1 and BRCA2. Results of the assessment will determine the need for genetic counseling and BRCA1 and BRCA2 testing.  The Pap test is a screening test for cervical cancer. Women should have a Pap test starting at age 21. Between ages 21 and 29, Pap tests should be repeated every 2 years. Beginning at age 30, you should have a Pap test every 3 years as long as the past 3 Pap tests have been normal. If you had a hysterectomy for a problem that was not cancer or a condition that could lead to cancer, then you no  longer need Pap tests. If you are between ages 65 and 70, and you have had normal Pap tests going back 10 years, you no longer need Pap tests. If you have had past treatment for cervical cancer or a condition that could lead to cancer, you need Pap tests and screening for cancer for at least 20 years after your treatment. If Pap tests have been discontinued, risk factors (such as a new sexual partner) need to be reassessed to determine if screening should be resumed. Some women have medical problems that increase the chance of getting cervical cancer. In these cases, your caregiver may recommend more frequent screening and Pap tests.  The human papillomavirus (HPV) test is an additional test that may be used for cervical cancer screening. The HPV test looks for the virus that can cause the cell changes on the cervix. The cells collected during the Pap test can be tested for HPV. The HPV test could be used to screen women aged 30 years and older, and   should be used in women of any age who have unclear Pap test results. After the age of 30, women should have HPV testing at the same frequency as a Pap test.  Colorectal cancer can be detected and often prevented. Most routine colorectal cancer screening begins at the age of 50 and continues through age 75. However, your caregiver may recommend screening at an earlier age if you have risk factors for colon cancer. On a yearly basis, your caregiver may provide home test kits to check for hidden blood in the stool. Use of a small camera at the end of a tube, to directly examine the colon (sigmoidoscopy or colonoscopy), can detect the earliest forms of colorectal cancer. Talk to your caregiver about this at age 50, when routine screening begins. Direct examination of the colon should be repeated every 5 to 10 years through age 75, unless early forms of pre-cancerous polyps or small growths are found.  Hepatitis C blood testing is recommended for all people born from  1945 through 1965 and any individual with known risks for hepatitis C.  Practice safe sex. Use condoms and avoid high-risk sexual practices to reduce the spread of sexually transmitted infections (STIs). Sexually active women aged 25 and younger should be checked for Chlamydia, which is a common sexually transmitted infection. Older women with new or multiple partners should also be tested for Chlamydia. Testing for other STIs is recommended if you are sexually active and at increased risk.  Osteoporosis is a disease in which the bones lose minerals and strength with aging. This can result in serious bone fractures. The risk of osteoporosis can be identified using a bone density scan. Women ages 65 and over and women at risk for fractures or osteoporosis should discuss screening with their caregivers. Ask your caregiver whether you should be taking a calcium supplement or vitamin D to reduce the rate of osteoporosis.  Menopause can be associated with physical symptoms and risks. Hormone replacement therapy is available to decrease symptoms and risks. You should talk to your caregiver about whether hormone replacement therapy is right for you.  Use sunscreen. Apply sunscreen liberally and repeatedly throughout the day. You should seek shade when your shadow is shorter than you. Protect yourself by wearing long sleeves, pants, a wide-brimmed hat, and sunglasses year round, whenever you are outdoors.  Notify your caregiver of new moles or changes in moles, especially if there is a change in shape or color. Also notify your caregiver if a mole is larger than the size of a pencil eraser.  Stay current with your immunizations. Document Released: 09/24/2010 Document Revised: 07/06/2012 Document Reviewed: 09/24/2010 [employer] Patient Information 2014 [employer].   

## 2014-04-13 LAB — URINALYSIS W MICROSCOPIC + REFLEX CULTURE
BILIRUBIN URINE: NEGATIVE
Bacteria, UA: NONE SEEN
CASTS: NONE SEEN
Crystals: NONE SEEN
GLUCOSE, UA: NEGATIVE mg/dL
HGB URINE DIPSTICK: NEGATIVE
Ketones, ur: NEGATIVE mg/dL
LEUKOCYTES UA: NEGATIVE
Nitrite: NEGATIVE
Protein, ur: NEGATIVE mg/dL
Specific Gravity, Urine: 1.008 (ref 1.005–1.030)
Squamous Epithelial / LPF: NONE SEEN
Urobilinogen, UA: 0.2 mg/dL (ref 0.0–1.0)
pH: 5.5 (ref 5.0–8.0)

## 2014-04-13 LAB — CYTOLOGY - PAP

## 2014-04-18 ENCOUNTER — Other Ambulatory Visit: Payer: Self-pay

## 2014-04-18 DIAGNOSIS — Z1231 Encounter for screening mammogram for malignant neoplasm of breast: Secondary | ICD-10-CM

## 2014-04-28 ENCOUNTER — Ambulatory Visit (INDEPENDENT_AMBULATORY_CARE_PROVIDER_SITE_OTHER): Payer: Medicare Other

## 2014-04-28 DIAGNOSIS — Z13820 Encounter for screening for osteoporosis: Secondary | ICD-10-CM

## 2014-05-13 ENCOUNTER — Ambulatory Visit
Admission: RE | Admit: 2014-05-13 | Discharge: 2014-05-13 | Disposition: A | Discharge status: Home / Self Care | Payer: Medicare Other | Source: Ambulatory Visit

## 2014-05-13 DIAGNOSIS — Z1231 Encounter for screening mammogram for malignant neoplasm of breast: Secondary | ICD-10-CM

## 2014-05-16 ENCOUNTER — Other Ambulatory Visit: Payer: Self-pay | Admitting: Gynecology

## 2014-05-16 DIAGNOSIS — R928 Other abnormal and inconclusive findings on diagnostic imaging of breast: Secondary | ICD-10-CM

## 2014-05-18 ENCOUNTER — Emergency Department (HOSPITAL_COMMUNITY)
Admission: EM | Admit: 2014-05-18 | Discharge: 2014-05-18 | Disposition: A | Discharge status: Home / Self Care | Payer: Medicare Other | Attending: Emergency Medicine | Admitting: Emergency Medicine

## 2014-05-18 ENCOUNTER — Encounter (HOSPITAL_COMMUNITY): Payer: Self-pay | Admitting: *Deleted

## 2014-05-18 DIAGNOSIS — R142 Eructation: Secondary | ICD-10-CM | POA: Insufficient documentation

## 2014-05-18 DIAGNOSIS — I10 Essential (primary) hypertension: Secondary | ICD-10-CM | POA: Diagnosis not present

## 2014-05-18 DIAGNOSIS — Z79899 Other long term (current) drug therapy: Secondary | ICD-10-CM | POA: Diagnosis not present

## 2014-05-18 DIAGNOSIS — Z791 Long term (current) use of non-steroidal anti-inflammatories (NSAID): Secondary | ICD-10-CM | POA: Insufficient documentation

## 2014-05-18 DIAGNOSIS — Z86010 Personal history of colonic polyps: Secondary | ICD-10-CM | POA: Diagnosis not present

## 2014-05-18 DIAGNOSIS — Z8739 Personal history of other diseases of the musculoskeletal system and connective tissue: Secondary | ICD-10-CM | POA: Diagnosis not present

## 2014-05-18 DIAGNOSIS — R079 Chest pain, unspecified: Secondary | ICD-10-CM | POA: Insufficient documentation

## 2014-05-18 DIAGNOSIS — Z7982 Long term (current) use of aspirin: Secondary | ICD-10-CM | POA: Insufficient documentation

## 2014-05-18 LAB — I-STAT TROPONIN, ED
TROPONIN I, POC: 0 ng/mL (ref 0.00–0.08)
Troponin i, poc: 0 ng/mL (ref 0.00–0.08)

## 2014-05-18 LAB — BASIC METABOLIC PANEL
Anion gap: 10 (ref 5–15)
BUN: 19 mg/dL (ref 6–23)
CALCIUM: 9 mg/dL (ref 8.4–10.5)
CO2: 24 mmol/L (ref 19–32)
CREATININE: 0.7 mg/dL (ref 0.50–1.10)
Chloride: 106 mmol/L (ref 96–112)
GFR calc Af Amer: >90 mL/min (ref >90)
GFR, EST NON AFRICAN AMERICAN: 86 mL/min — AB (ref >90)
GLUCOSE: 118 mg/dL — AB (ref 70–99)
Potassium: 3.5 mmol/L (ref 3.5–5.1)
Sodium: 140 mmol/L (ref 135–145)

## 2014-05-18 LAB — CBC
HCT: 43.1 % (ref 36.0–46.0)
HEMOGLOBIN: 14.3 g/dL (ref 12.0–15.0)
MCH: 32.6 pg (ref 26.0–34.0)
MCHC: 33.2 g/dL (ref 30.0–36.0)
MCV: 98.2 fL (ref 78.0–100.0)
Platelets: 262 10*3/uL (ref 150–400)
RBC: 4.39 MIL/uL (ref 3.87–5.11)
RDW: 13.3 % (ref 11.5–15.5)
WBC: 5.4 10*3/uL (ref 4.0–10.5)

## 2014-05-18 MED ORDER — POTASSIUM CHLORIDE 10 MEQ/100ML IV SOLN: 10.0000 meq | INTRAVENOUS | Status: DC

## 2014-05-18 NOTE — Discharge Instructions (Signed)
Return to ER with any worsening symptoms  Chest Pain (Nonspecific) It is often hard to give a specific diagnosis for the cause of chest pain. There is always a chance that your pain could be related to something serious, such as a heart attack or a blood clot in the lungs. You need to follow up with your health care provider for further evaluation. CAUSES   Heartburn.  Pneumonia or bronchitis.  Anxiety or stress.  Inflammation around your heart (pericarditis) or lung (pleuritis or pleurisy).  A blood clot in the lung.  A collapsed lung (pneumothorax). It can develop suddenly on its own (spontaneous pneumothorax) or from trauma to the chest.  Shingles infection (herpes zoster virus). The chest wall is composed of bones, muscles, and cartilage. Any of these can be the source of the pain.  The bones can be bruised by injury.  The muscles or cartilage can be strained by coughing or overwork.  The cartilage can be affected by inflammation and become sore (costochondritis). DIAGNOSIS  Lab tests or other studies may be needed to find the cause of your pain. Your health care provider may have you take a test called an ambulatory electrocardiogram (ECG). An ECG records your heartbeat patterns over a 24-hour period. You may also have other tests, such as:  Transthoracic echocardiogram (TTE). During echocardiography, sound waves are used to evaluate how blood flows through your heart.  Transesophageal echocardiogram (TEE).  Cardiac monitoring. This allows your health care provider to monitor your heart rate and rhythm in real time.  Holter monitor. This is a portable device that records your heartbeat and can help diagnose heart arrhythmias. It allows your health care provider to track your heart activity for several days, if needed.  Stress tests by exercise or by giving medicine that makes the heart beat faster. TREATMENT   Treatment depends on what may be causing your chest pain.  Treatment may include:  Acid blockers for heartburn.  Anti-inflammatory medicine.  Pain medicine for inflammatory conditions.  Antibiotics if an infection is present.  You may be advised to change lifestyle habits. This includes stopping smoking and avoiding alcohol, caffeine, and chocolate.  You may be advised to keep your head raised (elevated) when sleeping. This reduces the chance of acid going backward from your stomach into your esophagus. Most of the time, nonspecific chest pain will improve within 2-3 days with rest and mild pain medicine.  HOME CARE INSTRUCTIONS   If antibiotics were prescribed, take them as directed. Finish them even if you start to feel better.  For the next few days, avoid physical activities that bring on chest pain. Continue physical activities as directed.  Do not use any tobacco products, including cigarettes, chewing tobacco, or electronic cigarettes.  Avoid drinking alcohol.  Only take medicine as directed by your health care provider.  Follow your health care provider's suggestions for further testing if your chest pain does not go away.  Keep any follow-up appointments you made. If you do not go to an appointment, you could develop lasting (chronic) problems with pain. If there is any problem keeping an appointment, call to reschedule. SEEK MEDICAL CARE IF:   Your chest pain does not go away, even after treatment.  You have a rash with blisters on your chest.  You have a fever. SEEK IMMEDIATE MEDICAL CARE IF:   You have increased chest pain or pain that spreads to your arm, neck, jaw, back, or abdomen.  You have shortness of breath.  You have an increasing cough, or you cough up blood.  You have severe back or abdominal pain.  You feel nauseous or vomit.  You have severe weakness.  You faint.  You have chills. This is an emergency. Do not wait to see if the pain will go away. Get medical help at once. Call your local emergency  services (911 in [place] Do not drive yourself to the hospital. MAKE SURE YOU:   Understand these instructions.  Will watch your condition.  Will get help right away if you are not doing well or get worse. Document Released: 12/19/2004 Document Revised: 03/16/2013 Document Reviewed: 10/15/2007 [facility] Patient Information 2015 [place], [place]. This information is not intended to replace advice given to you by your health care provider. Make sure you discuss any questions you have with your health care provider.

## 2014-05-18 NOTE — ED Provider Notes (Signed)
CSN: [id number]     Arrival date & time 05/18/14  1542 History   First MD Initiated Contact with Patient 05/18/14 1911     Chief Complaint  Patient presents with  . Chest Pain      HPI  She presents for evaluation of chest pain since last night. Over 24 hours of symptoms. However presents here states her symptoms went away while in the emergency room. Globally anterior chest nonspecific. Not particularly pleuritic. No exertional pain or symptoms. No history of hypertension diabetes smoking hypercholesterolemia or family history.  Has several belches on the way to the hospital and presents here symptom-free.  Past Medical History  Diagnosis Date  . Intermittent low back pain     Right sided, radiating into her legs  . Tachycardia     Problems with tachycardia with negative eval, Dr. Daneen Schick & a normal treadmill exam 03/2001 asymptomatic since that time. Repeat cardiolite stress test 01/2011 - normal.  . Plantar fasciitis     Intermittent  . DDD (degenerative disc disease)   . Belching     Saw Dr. Watt Climes in 05/2013 for excessive burping. Was given Prilosec & will add probiotic after 2 weeks.  . Tubular adenoma 05/28/11    By colon cancer screening. Also found hyperplastic polyps.  . Personal history of colonic polyps 05/28/11    By colon cancer screening. Also found tubular adenomas.  Marland Kitchen Postnasal drip   . Elevated blood pressure reading without diagnosis of hypertension   . Impaired fasting glucose   . Hypertension    Past Surgical History  Procedure Laterality Date  . Nasal septum surgery     Family History  Problem Relation Age of Onset  . Kidney failure Mother   . CAD Mother   . Parkinson's disease Father   . Deep vein thrombosis Father   . CAD Father    History  Substance Use Topics  . Smoking status: Never Smoker   . Smokeless tobacco: Not on file  . Alcohol Use: 0.0 oz/week    0 Standard drinks or equivalent per week     Comment: Rare   OB History    Gravida  Para Term Preterm AB TAB SAB Ectopic Multiple Living   2 2        2      Review of Systems  Constitutional: Negative for fever, chills, diaphoresis, appetite change and fatigue.  HENT: Negative for mouth sores, sore throat and trouble swallowing.   Eyes: Negative for visual disturbance.  Respiratory: Negative for cough, chest tightness, shortness of breath and wheezing.   Cardiovascular: Positive for chest pain.  Gastrointestinal: Negative for nausea, vomiting, abdominal pain, diarrhea and abdominal distention.       "Belching"  Endocrine: Negative for polydipsia, polyphagia and polyuria.  Genitourinary: Negative for dysuria, frequency and hematuria.  Musculoskeletal: Negative for gait problem.  Skin: Negative for color change, pallor and rash.  Neurological: Negative for dizziness, syncope, light-headedness and headaches.  Hematological: Does not bruise/bleed easily.  Psychiatric/Behavioral: Negative for behavioral problems and confusion.      Allergies  Review of patient's allergies indicates no known allergies.  Home Medications   Prior to Admission medications   Medication Sig Start Date End Date Taking? Authorizing Provider  aspirin EC 81 MG tablet Take 81 mg by mouth daily.    Historical Provider, MD  b complex vitamins capsule Take 1 capsule by mouth daily.    Historical Provider, MD  CALCIUM PO Take 500 mg by  mouth daily. Take with meals.    Historical Provider, MD  cholecalciferol (VITAMIN D) 1000 UNITS tablet Take 1,000 Units by mouth daily.    Historical Provider, MD  diclofenac (VOLTAREN) 50 MG EC tablet Take 50 mg by mouth 2 (two) times daily.    Historical Provider, MD  Multiple Vitamin (MULTIVITAMIN) capsule Take 1 capsule by mouth daily.    Historical Provider, MD  Omega-3 Fatty Acids (FISH OIL) 1000 MG CAPS Take 1 capsule by mouth daily. Take with a meal    Historical Provider, MD   BP 168/82 mmHg  Pulse 113  Temp(Src) 97.8 F (36.6 C) (Oral)  Resp 20  Ht 5'  5" (1.651 m)  Wt 168 lb (76.204 kg)  BMI 27.96 kg/m2  SpO2 95% Physical Exam  Constitutional: She is oriented to person, place, and time. She appears well-developed and well-nourished. No distress.  HENT:  Head: Normocephalic.  Eyes: Conjunctivae are normal. Pupils are equal, round, and reactive to light. No scleral icterus.  Neck: Normal range of motion. Neck supple. No thyromegaly present.  Cardiovascular: Normal rate and regular rhythm.  Exam reveals no gallop and no friction rub.   No murmur heard. Pulmonary/Chest: Effort normal and breath sounds normal. No respiratory distress. She has no wheezes. She has no rales.  Abdominal: Soft. Bowel sounds are normal. She exhibits no distension. There is no tenderness. There is no rebound.  Musculoskeletal: Normal range of motion.  Neurological: She is alert and oriented to person, place, and time.  Skin: Skin is warm and dry. No rash noted.  Psychiatric: She has a normal mood and affect. Her behavior is normal.    ED Course  Procedures (including critical care time) Labs Review Labs Reviewed  BASIC METABOLIC PANEL - Abnormal; Notable for the following:    Glucose, Bld 118 (*)    GFR calc non Af Amer 86 (*)    All other components within normal limits  CBC  I-STAT TROPOININ, ED  I-STAT TROPOININ, ED    Imaging Review No results found.   EKG Interpretation   Date/Time:  Wednesday May 18 2014 15:45:12 EST Ventricular Rate:  109 PR Interval:  154 QRS Duration: 86 QT Interval:  340 QTC Calculation: 457 R Axis:   66 Text Interpretation:  Sinus tachycardia Otherwise normal ECG Confirmed by  Jeneen Rinks  MD, [place] ([id number]) on 05/18/2014 7:12:06 PM      MDM   Final diagnoses:  Chest pain, unspecified chest pain type    No findings on EKG. Normal troponin after greater than 24 hours of symptoms. No findings or symptoms suggest PE and no risk for PE. Plan his primary care follow-up.    Tanna Furry, MD 05/23/14 2125

## 2014-05-18 NOTE — ED Notes (Signed)
Pt c/o intermittent non radiating central chest pain since last night. Pt denies any associated symptoms. Pt has not taken any medication for the pain.

## 2014-05-18 NOTE — ED Notes (Signed)
Pt denies any complaints at this time. NAD noted. Pt given discharge instructions and encouraged to follow up with PCP. Pt refused wheelchair. Pt ambulatory on discharge.

## 2014-05-20 ENCOUNTER — Other Ambulatory Visit: Payer: Self-pay | Admitting: Gastroenterology

## 2014-05-23 ENCOUNTER — Ambulatory Visit
Admission: RE | Admit: 2014-05-23 | Discharge: 2014-05-23 | Disposition: A | Discharge status: Home / Self Care | Payer: Medicare Other | Source: Ambulatory Visit | Attending: Gynecology | Admitting: Gynecology

## 2014-05-23 DIAGNOSIS — R928 Other abnormal and inconclusive findings on diagnostic imaging of breast: Secondary | ICD-10-CM

## 2014-09-19 ENCOUNTER — Other Ambulatory Visit: Payer: Self-pay

## 2014-10-12 ENCOUNTER — Other Ambulatory Visit: Payer: Self-pay | Admitting: Gynecology

## 2014-10-12 DIAGNOSIS — R921 Mammographic calcification found on diagnostic imaging of breast: Secondary | ICD-10-CM

## 2014-11-22 ENCOUNTER — Ambulatory Visit
Admission: RE | Admit: 2014-11-22 | Discharge: 2014-11-22 | Disposition: A | Discharge status: Home / Self Care | Payer: Medicare Other | Source: Ambulatory Visit | Attending: Gynecology | Admitting: Gynecology

## 2014-11-22 DIAGNOSIS — R921 Mammographic calcification found on diagnostic imaging of breast: Secondary | ICD-10-CM

## 2015-04-28 ENCOUNTER — Other Ambulatory Visit: Payer: Self-pay | Admitting: Gynecology

## 2015-04-28 DIAGNOSIS — R921 Mammographic calcification found on diagnostic imaging of breast: Secondary | ICD-10-CM

## 2015-05-16 ENCOUNTER — Other Ambulatory Visit: Payer: Self-pay

## 2015-05-16 ENCOUNTER — Other Ambulatory Visit: Payer: Self-pay | Admitting: Gynecology

## 2015-05-16 DIAGNOSIS — R921 Mammographic calcification found on diagnostic imaging of breast: Secondary | ICD-10-CM

## 2015-05-17 ENCOUNTER — Ambulatory Visit
Admission: RE | Admit: 2015-05-17 | Discharge: 2015-05-17 | Disposition: A | Discharge status: Home / Self Care | Payer: Medicare Other | Source: Ambulatory Visit | Attending: Gynecology | Admitting: Gynecology

## 2015-05-17 DIAGNOSIS — R921 Mammographic calcification found on diagnostic imaging of breast: Secondary | ICD-10-CM

## 2016-05-14 ENCOUNTER — Other Ambulatory Visit: Payer: Self-pay | Admitting: Gynecology

## 2016-05-14 DIAGNOSIS — R921 Mammographic calcification found on diagnostic imaging of breast: Secondary | ICD-10-CM

## 2016-05-20 ENCOUNTER — Ambulatory Visit
Admission: RE | Admit: 2016-05-20 | Discharge: 2016-05-20 | Disposition: A | Discharge status: Home / Self Care | Payer: Medicare Other | Source: Ambulatory Visit | Attending: Gynecology | Admitting: Gynecology

## 2016-05-20 DIAGNOSIS — R921 Mammographic calcification found on diagnostic imaging of breast: Secondary | ICD-10-CM

## 2017-04-24 ENCOUNTER — Other Ambulatory Visit: Payer: Self-pay | Admitting: Gynecology

## 2017-04-24 DIAGNOSIS — Z139 Encounter for screening, unspecified: Secondary | ICD-10-CM

## 2017-05-21 ENCOUNTER — Ambulatory Visit
Admission: RE | Admit: 2017-05-21 | Discharge: 2017-05-21 | Disposition: A | Discharge status: Home / Self Care | Payer: Medicare Other | Source: Ambulatory Visit | Attending: Gynecology | Admitting: Gynecology

## 2017-05-21 DIAGNOSIS — Z139 Encounter for screening, unspecified: Secondary | ICD-10-CM

## 2018-04-20 ENCOUNTER — Other Ambulatory Visit: Payer: Self-pay | Admitting: Gynecology

## 2018-04-20 DIAGNOSIS — Z1231 Encounter for screening mammogram for malignant neoplasm of breast: Secondary | ICD-10-CM

## 2018-04-28 ENCOUNTER — Ambulatory Visit: Payer: Medicare Other | Admitting: Gynecology

## 2018-04-28 ENCOUNTER — Encounter: Payer: Self-pay | Admitting: Gynecology

## 2018-04-28 VITALS — BP 140/80 | Ht 64.5 in | Wt 175.0 lb

## 2018-04-28 DIAGNOSIS — Z01419 Encounter for gynecological examination (general) (routine) without abnormal findings: Secondary | ICD-10-CM

## 2018-04-28 DIAGNOSIS — N952 Postmenopausal atrophic vaginitis: Secondary | ICD-10-CM

## 2018-04-28 NOTE — Progress Notes (Signed)
    Amy [name] June 02, 1945 [id number]        72 y.o.  G2P2 for annual gynecologic exam.  Without gynecologic complaints  Past medical history,surgical history, problem list, medications, allergies, family history and social history were all reviewed and documented as reviewed in the EPIC chart.  ROS:  Performed with pertinent positives and negatives included in the history, assessment and plan.   Additional significant findings : None   Exam: Caryn Bee assistant Vitals:   04/28/18 0939  BP: 140/80  Weight: 175 lb (79.4 kg)  Height: 5' 4.5" (1.638 m)   Body mass index is 29.57 kg/m.  General appearance:  Normal affect, orientation and appearance. Skin: Grossly normal HEENT: Without gross lesions.  No cervical or supraclavicular adenopathy. Thyroid normal.  Lungs:  Clear without wheezing, rales or rhonchi Cardiac: RR, without RMG Abdominal:  Soft, nontender, without masses, guarding, rebound, organomegaly or hernia Breasts:  Examined lying and sitting without masses, retractions, discharge or axillary adenopathy. Pelvic:  Ext, BUS, Vagina: With atrophic changes  Cervix: With atrophic changes  Uterus: Anteverted, normal size, shape and contour, midline and mobile nontender   Adnexa: Without masses or tenderness    Anus and perineum: Normal   Rectovaginal: Normal sphincter tone without palpated masses or tenderness.    Assessment/Plan:  73 y.o. G2P2 female for annual gynecologic exam.  1. Postmenopausal.  No significant menopausal symptoms or any vaginal bleeding. 2. Pap smear 2016.  Pap smear done today.  Options to stop screening per current screening guidelines based on age and no history of abnormal Pap smears previously discussed.  At this point the patient feels more comfortable with screening. 3. Mammography scheduled in March.  Breast exam normal today. 4. DEXA 2016 normal.  Recommend repeat DEXA next year at 5-year interval. 5. Colonoscopy 2013.  Repeat at  their recommended interval. 6. Health maintenance.  No routine lab work done as patient does this elsewhere.  We discussed routine exam intervals and whether annually or every other year.  Likelihood of finding significant pathology in an asymptomatic patient versus missing pathology reviewed.  Patient will schedule her follow-up exam at her choice.   Anastasio Auerbach MD, 10:11 AM 04/28/2018

## 2018-04-28 NOTE — Addendum Note (Signed)
Addended by: Nelva Nay on: 04/28/2018 11:25 AM   Modules accepted: Orders

## 2018-04-28 NOTE — Patient Instructions (Signed)
Follow-up in 1 to 2 years for your next exam as we discussed.

## 2018-04-29 LAB — PAP IG W/ RFLX HPV ASCU

## 2018-05-26 ENCOUNTER — Ambulatory Visit
Admission: RE | Admit: 2018-05-26 | Discharge: 2018-05-26 | Disposition: A | Discharge status: Home / Self Care | Payer: Medicare Other | Source: Ambulatory Visit | Attending: Gynecology | Admitting: Gynecology

## 2018-05-26 DIAGNOSIS — Z1231 Encounter for screening mammogram for malignant neoplasm of breast: Secondary | ICD-10-CM

## 2018-12-30 ENCOUNTER — Encounter: Payer: Self-pay | Admitting: Gynecology

## 2019-05-11 ENCOUNTER — Other Ambulatory Visit: Payer: Self-pay | Admitting: Family Medicine

## 2019-05-11 DIAGNOSIS — R5381 Other malaise: Secondary | ICD-10-CM

## 2019-05-26 ENCOUNTER — Other Ambulatory Visit: Payer: Self-pay | Admitting: Family Medicine

## 2019-05-26 DIAGNOSIS — E2839 Other primary ovarian failure: Secondary | ICD-10-CM

## 2019-05-26 DIAGNOSIS — Z1231 Encounter for screening mammogram for malignant neoplasm of breast: Secondary | ICD-10-CM

## 2019-07-07 ENCOUNTER — Other Ambulatory Visit: Payer: Self-pay

## 2019-07-07 ENCOUNTER — Ambulatory Visit
Admission: RE | Admit: 2019-07-07 | Discharge: 2019-07-07 | Disposition: A | Discharge status: Home / Self Care | Payer: Medicare Other | Source: Ambulatory Visit | Attending: Family Medicine | Admitting: Family Medicine

## 2019-07-07 DIAGNOSIS — Z1231 Encounter for screening mammogram for malignant neoplasm of breast: Secondary | ICD-10-CM

## 2019-08-09 ENCOUNTER — Other Ambulatory Visit: Payer: Self-pay

## 2019-08-09 ENCOUNTER — Ambulatory Visit
Admission: RE | Admit: 2019-08-09 | Discharge: 2019-08-09 | Disposition: A | Discharge status: Home / Self Care | Payer: Medicare Other | Source: Ambulatory Visit | Attending: Family Medicine | Admitting: Family Medicine

## 2019-08-09 DIAGNOSIS — E2839 Other primary ovarian failure: Secondary | ICD-10-CM

## 2020-04-21 DIAGNOSIS — M1712 Unilateral primary osteoarthritis, left knee: Secondary | ICD-10-CM | POA: Diagnosis not present

## 2020-05-08 DIAGNOSIS — M170 Bilateral primary osteoarthritis of knee: Secondary | ICD-10-CM | POA: Diagnosis not present

## 2020-05-08 DIAGNOSIS — R231 Pallor: Secondary | ICD-10-CM | POA: Diagnosis not present

## 2020-05-08 DIAGNOSIS — M23204 Derangement of unspecified medial meniscus due to old tear or injury, left knee: Secondary | ICD-10-CM | POA: Diagnosis not present

## 2020-05-08 DIAGNOSIS — Z636 Dependent relative needing care at home: Secondary | ICD-10-CM | POA: Diagnosis not present

## 2020-05-08 DIAGNOSIS — K219 Gastro-esophageal reflux disease without esophagitis: Secondary | ICD-10-CM | POA: Diagnosis not present

## 2020-05-08 DIAGNOSIS — Z0000 Encounter for general adult medical examination without abnormal findings: Secondary | ICD-10-CM | POA: Diagnosis not present

## 2020-05-08 DIAGNOSIS — Z23 Encounter for immunization: Secondary | ICD-10-CM | POA: Diagnosis not present

## 2020-05-08 DIAGNOSIS — J309 Allergic rhinitis, unspecified: Secondary | ICD-10-CM | POA: Diagnosis not present

## 2020-05-08 DIAGNOSIS — Z1389 Encounter for screening for other disorder: Secondary | ICD-10-CM | POA: Diagnosis not present

## 2020-05-08 DIAGNOSIS — I10 Essential (primary) hypertension: Secondary | ICD-10-CM | POA: Diagnosis not present

## 2020-05-24 ENCOUNTER — Other Ambulatory Visit: Payer: Self-pay | Admitting: Family Medicine

## 2020-05-24 DIAGNOSIS — Z1231 Encounter for screening mammogram for malignant neoplasm of breast: Secondary | ICD-10-CM

## 2020-07-14 ENCOUNTER — Inpatient Hospital Stay: Admission: RE | Admit: 2020-07-14 | Payer: Medicare Other | Source: Ambulatory Visit

## 2020-09-01 ENCOUNTER — Ambulatory Visit
Admission: RE | Admit: 2020-09-01 | Discharge: 2020-09-01 | Disposition: A | Discharge status: Home / Self Care | Payer: Medicare Other | Source: Ambulatory Visit | Attending: Family Medicine | Admitting: Family Medicine

## 2020-09-01 ENCOUNTER — Other Ambulatory Visit: Payer: Self-pay

## 2020-09-01 DIAGNOSIS — Z1231 Encounter for screening mammogram for malignant neoplasm of breast: Secondary | ICD-10-CM

## 2020-11-13 DIAGNOSIS — R232 Flushing: Secondary | ICD-10-CM | POA: Diagnosis not present

## 2020-11-13 DIAGNOSIS — M170 Bilateral primary osteoarthritis of knee: Secondary | ICD-10-CM | POA: Diagnosis not present

## 2020-11-13 DIAGNOSIS — K219 Gastro-esophageal reflux disease without esophagitis: Secondary | ICD-10-CM | POA: Diagnosis not present

## 2020-11-13 DIAGNOSIS — I10 Essential (primary) hypertension: Secondary | ICD-10-CM | POA: Diagnosis not present

## 2020-11-14 DIAGNOSIS — I10 Essential (primary) hypertension: Secondary | ICD-10-CM | POA: Diagnosis not present

## 2020-11-14 DIAGNOSIS — R232 Flushing: Secondary | ICD-10-CM | POA: Diagnosis not present

## 2021-01-01 DIAGNOSIS — H04123 Dry eye syndrome of bilateral lacrimal glands: Secondary | ICD-10-CM | POA: Diagnosis not present

## 2021-01-01 DIAGNOSIS — H25813 Combined forms of age-related cataract, bilateral: Secondary | ICD-10-CM | POA: Diagnosis not present

## 2021-05-09 DIAGNOSIS — I10 Essential (primary) hypertension: Secondary | ICD-10-CM | POA: Diagnosis not present

## 2021-05-14 DIAGNOSIS — Z1389 Encounter for screening for other disorder: Secondary | ICD-10-CM | POA: Diagnosis not present

## 2021-05-14 DIAGNOSIS — I10 Essential (primary) hypertension: Secondary | ICD-10-CM | POA: Diagnosis not present

## 2021-05-14 DIAGNOSIS — M170 Bilateral primary osteoarthritis of knee: Secondary | ICD-10-CM | POA: Diagnosis not present

## 2021-05-14 DIAGNOSIS — R252 Cramp and spasm: Secondary | ICD-10-CM | POA: Diagnosis not present

## 2021-05-14 DIAGNOSIS — K219 Gastro-esophageal reflux disease without esophagitis: Secondary | ICD-10-CM | POA: Diagnosis not present

## 2021-05-14 DIAGNOSIS — Z0000 Encounter for general adult medical examination without abnormal findings: Secondary | ICD-10-CM | POA: Diagnosis not present

## 2021-05-14 DIAGNOSIS — Z86010 Personal history of colonic polyps: Secondary | ICD-10-CM | POA: Diagnosis not present

## 2021-06-11 DIAGNOSIS — R109 Unspecified abdominal pain: Secondary | ICD-10-CM | POA: Diagnosis not present

## 2021-06-11 DIAGNOSIS — K219 Gastro-esophageal reflux disease without esophagitis: Secondary | ICD-10-CM | POA: Diagnosis not present

## 2021-07-30 ENCOUNTER — Other Ambulatory Visit: Payer: Self-pay | Admitting: Family Medicine

## 2021-07-30 DIAGNOSIS — Z1231 Encounter for screening mammogram for malignant neoplasm of breast: Secondary | ICD-10-CM

## 2021-08-02 DIAGNOSIS — W57XXXA Bitten or stung by nonvenomous insect and other nonvenomous arthropods, initial encounter: Secondary | ICD-10-CM | POA: Diagnosis not present

## 2021-08-02 DIAGNOSIS — H60501 Unspecified acute noninfective otitis externa, right ear: Secondary | ICD-10-CM | POA: Diagnosis not present

## 2021-08-02 DIAGNOSIS — S80861A Insect bite (nonvenomous), right lower leg, initial encounter: Secondary | ICD-10-CM | POA: Diagnosis not present

## 2021-08-10 DIAGNOSIS — H6501 Acute serous otitis media, right ear: Secondary | ICD-10-CM | POA: Diagnosis not present

## 2021-08-10 DIAGNOSIS — H6123 Impacted cerumen, bilateral: Secondary | ICD-10-CM | POA: Diagnosis not present

## 2021-08-10 DIAGNOSIS — H9191 Unspecified hearing loss, right ear: Secondary | ICD-10-CM | POA: Diagnosis not present

## 2021-08-17 DIAGNOSIS — H6123 Impacted cerumen, bilateral: Secondary | ICD-10-CM | POA: Diagnosis not present

## 2021-09-03 ENCOUNTER — Ambulatory Visit: Payer: Medicare Other

## 2021-09-18 ENCOUNTER — Ambulatory Visit
Admission: RE | Admit: 2021-09-18 | Discharge: 2021-09-18 | Disposition: A | Discharge status: Home / Self Care | Payer: Medicare Other | Source: Ambulatory Visit | Attending: Family Medicine | Admitting: Family Medicine

## 2021-09-18 DIAGNOSIS — Z1231 Encounter for screening mammogram for malignant neoplasm of breast: Secondary | ICD-10-CM | POA: Diagnosis not present

## 2021-10-30 DIAGNOSIS — K649 Unspecified hemorrhoids: Secondary | ICD-10-CM | POA: Diagnosis not present

## 2021-10-30 DIAGNOSIS — K5730 Diverticulosis of large intestine without perforation or abscess without bleeding: Secondary | ICD-10-CM | POA: Diagnosis not present

## 2021-10-30 DIAGNOSIS — Z86010 Personal history of colonic polyps: Secondary | ICD-10-CM | POA: Diagnosis not present

## 2021-10-30 DIAGNOSIS — Z09 Encounter for follow-up examination after completed treatment for conditions other than malignant neoplasm: Secondary | ICD-10-CM | POA: Diagnosis not present

## 2021-10-30 DIAGNOSIS — D123 Benign neoplasm of transverse colon: Secondary | ICD-10-CM | POA: Diagnosis not present

## 2021-11-01 DIAGNOSIS — D123 Benign neoplasm of transverse colon: Secondary | ICD-10-CM | POA: Diagnosis not present

## 2021-11-15 DIAGNOSIS — H60543 Acute eczematoid otitis externa, bilateral: Secondary | ICD-10-CM | POA: Diagnosis not present

## 2021-11-15 DIAGNOSIS — K219 Gastro-esophageal reflux disease without esophagitis: Secondary | ICD-10-CM | POA: Diagnosis not present

## 2021-11-15 DIAGNOSIS — Z23 Encounter for immunization: Secondary | ICD-10-CM | POA: Diagnosis not present

## 2021-11-15 DIAGNOSIS — I10 Essential (primary) hypertension: Secondary | ICD-10-CM | POA: Diagnosis not present

## 2022-01-14 DIAGNOSIS — Z961 Presence of intraocular lens: Secondary | ICD-10-CM | POA: Diagnosis not present

## 2022-01-14 DIAGNOSIS — H10013 Acute follicular conjunctivitis, bilateral: Secondary | ICD-10-CM | POA: Diagnosis not present

## 2022-01-14 DIAGNOSIS — H25811 Combined forms of age-related cataract, right eye: Secondary | ICD-10-CM | POA: Diagnosis not present

## 2022-04-04 DIAGNOSIS — R1310 Dysphagia, unspecified: Secondary | ICD-10-CM | POA: Diagnosis not present

## 2022-04-04 DIAGNOSIS — R194 Change in bowel habit: Secondary | ICD-10-CM | POA: Diagnosis not present

## 2022-05-22 DIAGNOSIS — K219 Gastro-esophageal reflux disease without esophagitis: Secondary | ICD-10-CM | POA: Diagnosis not present

## 2022-05-22 DIAGNOSIS — R1310 Dysphagia, unspecified: Secondary | ICD-10-CM | POA: Diagnosis not present

## 2022-05-22 DIAGNOSIS — H60543 Acute eczematoid otitis externa, bilateral: Secondary | ICD-10-CM | POA: Diagnosis not present

## 2022-05-22 DIAGNOSIS — I10 Essential (primary) hypertension: Secondary | ICD-10-CM | POA: Diagnosis not present

## 2022-05-22 DIAGNOSIS — Z0000 Encounter for general adult medical examination without abnormal findings: Secondary | ICD-10-CM | POA: Diagnosis not present

## 2022-08-02 DIAGNOSIS — R053 Chronic cough: Secondary | ICD-10-CM | POA: Diagnosis not present

## 2022-08-02 DIAGNOSIS — I872 Venous insufficiency (chronic) (peripheral): Secondary | ICD-10-CM | POA: Diagnosis not present

## 2022-08-23 ENCOUNTER — Other Ambulatory Visit: Payer: Self-pay | Admitting: Family Medicine

## 2022-08-23 DIAGNOSIS — Z1231 Encounter for screening mammogram for malignant neoplasm of breast: Secondary | ICD-10-CM

## 2022-09-20 ENCOUNTER — Ambulatory Visit: Payer: Medicare Other

## 2022-10-02 ENCOUNTER — Ambulatory Visit
Admission: RE | Admit: 2022-10-02 | Discharge: 2022-10-02 | Disposition: A | Discharge status: Home / Self Care | Payer: Medicare Other | Source: Ambulatory Visit | Attending: Family Medicine | Admitting: Family Medicine

## 2022-10-02 DIAGNOSIS — Z1231 Encounter for screening mammogram for malignant neoplasm of breast: Secondary | ICD-10-CM

## 2022-11-21 DIAGNOSIS — R1310 Dysphagia, unspecified: Secondary | ICD-10-CM | POA: Diagnosis not present

## 2022-11-21 DIAGNOSIS — K219 Gastro-esophageal reflux disease without esophagitis: Secondary | ICD-10-CM | POA: Diagnosis not present

## 2022-11-21 DIAGNOSIS — I10 Essential (primary) hypertension: Secondary | ICD-10-CM | POA: Diagnosis not present

## 2023-01-20 DIAGNOSIS — H25813 Combined forms of age-related cataract, bilateral: Secondary | ICD-10-CM | POA: Diagnosis not present

## 2023-04-17 DIAGNOSIS — M542 Cervicalgia: Secondary | ICD-10-CM | POA: Diagnosis not present

## 2023-04-17 DIAGNOSIS — M19011 Primary osteoarthritis, right shoulder: Secondary | ICD-10-CM | POA: Diagnosis not present

## 2023-05-27 DIAGNOSIS — E782 Mixed hyperlipidemia: Secondary | ICD-10-CM | POA: Diagnosis not present

## 2023-05-27 DIAGNOSIS — Z0000 Encounter for general adult medical examination without abnormal findings: Secondary | ICD-10-CM | POA: Diagnosis not present

## 2023-05-27 DIAGNOSIS — H6123 Impacted cerumen, bilateral: Secondary | ICD-10-CM | POA: Diagnosis not present

## 2023-05-27 DIAGNOSIS — I10 Essential (primary) hypertension: Secondary | ICD-10-CM | POA: Diagnosis not present

## 2023-07-18 DIAGNOSIS — H6122 Impacted cerumen, left ear: Secondary | ICD-10-CM | POA: Diagnosis not present

## 2023-10-06 ENCOUNTER — Other Ambulatory Visit: Payer: Self-pay

## 2023-10-06 ENCOUNTER — Emergency Department (HOSPITAL_BASED_OUTPATIENT_CLINIC_OR_DEPARTMENT_OTHER)
Admission: EM | Admit: 2023-10-06 | Discharge: 2023-10-06 | Disposition: A | Discharge status: Home / Self Care | Attending: Emergency Medicine | Admitting: Emergency Medicine

## 2023-10-06 ENCOUNTER — Emergency Department (HOSPITAL_BASED_OUTPATIENT_CLINIC_OR_DEPARTMENT_OTHER): Admitting: Radiology

## 2023-10-06 ENCOUNTER — Encounter (HOSPITAL_BASED_OUTPATIENT_CLINIC_OR_DEPARTMENT_OTHER): Payer: Self-pay | Admitting: *Deleted

## 2023-10-06 DIAGNOSIS — M79622 Pain in left upper arm: Secondary | ICD-10-CM | POA: Diagnosis not present

## 2023-10-06 DIAGNOSIS — R197 Diarrhea, unspecified: Secondary | ICD-10-CM | POA: Diagnosis not present

## 2023-10-06 DIAGNOSIS — I110 Hypertensive heart disease with heart failure: Secondary | ICD-10-CM | POA: Insufficient documentation

## 2023-10-06 DIAGNOSIS — Z85038 Personal history of other malignant neoplasm of large intestine: Secondary | ICD-10-CM | POA: Diagnosis not present

## 2023-10-06 DIAGNOSIS — Q676 Pectus excavatum: Secondary | ICD-10-CM | POA: Diagnosis not present

## 2023-10-06 DIAGNOSIS — R0789 Other chest pain: Secondary | ICD-10-CM | POA: Insufficient documentation

## 2023-10-06 DIAGNOSIS — I509 Heart failure, unspecified: Secondary | ICD-10-CM | POA: Insufficient documentation

## 2023-10-06 DIAGNOSIS — Z79899 Other long term (current) drug therapy: Secondary | ICD-10-CM | POA: Insufficient documentation

## 2023-10-06 DIAGNOSIS — R079 Chest pain, unspecified: Secondary | ICD-10-CM | POA: Diagnosis not present

## 2023-10-06 LAB — CBC
HCT: 41.4 % (ref 36.0–46.0)
Hemoglobin: 14.1 g/dL (ref 12.0–15.0)
MCH: 32.9 pg (ref 26.0–34.0)
MCHC: 34.1 g/dL (ref 30.0–36.0)
MCV: 96.7 fL (ref 80.0–100.0)
Platelets: 268 K/uL (ref 150–400)
RBC: 4.28 MIL/uL (ref 3.87–5.11)
RDW: 12.7 % (ref 11.5–15.5)
WBC: 5.4 K/uL (ref 4.0–10.5)
nRBC: 0 % (ref 0.0–0.2)

## 2023-10-06 LAB — HEPATIC FUNCTION PANEL
ALT: 12 U/L (ref 0–44)
AST: 23 U/L (ref 15–41)
Albumin: 4.6 g/dL (ref 3.5–5.0)
Alkaline Phosphatase: 79 U/L (ref 38–126)
Bilirubin, Direct: 0.2 mg/dL (ref 0.0–0.2)
Indirect Bilirubin: 0.3 mg/dL (ref 0.3–0.9)
Total Bilirubin: 0.5 mg/dL (ref 0.0–1.2)
Total Protein: 7.7 g/dL (ref 6.5–8.1)

## 2023-10-06 LAB — BASIC METABOLIC PANEL WITH GFR
Anion gap: 14 (ref 5–15)
BUN: 14 mg/dL (ref 8–23)
CO2: 21 mmol/L — ABNORMAL LOW (ref 22–32)
Calcium: 9.7 mg/dL (ref 8.9–10.3)
Chloride: 104 mmol/L (ref 98–111)
Creatinine, Ser: 0.92 mg/dL (ref 0.44–1.00)
GFR, Estimated: >60 mL/min (ref >60)
Glucose, Bld: 123 mg/dL — ABNORMAL HIGH (ref 70–99)
Potassium: 3.9 mmol/L (ref 3.5–5.1)
Sodium: 140 mmol/L (ref 135–145)

## 2023-10-06 LAB — TROPONIN T, HIGH SENSITIVITY
Troponin T High Sensitivity: <15 ng/L (ref <19)
Troponin T High Sensitivity: <15 ng/L (ref <19)

## 2023-10-06 LAB — LIPASE, BLOOD: Lipase: 57 U/L — ABNORMAL HIGH (ref 11–51)

## 2023-10-06 NOTE — Discharge Instructions (Addendum)
 Thank you for coming to [facility] Emergency Department. You were seen for chest pressure, left upper arm pain, and diarrhea. We did an exam, labs, and imaging, and these showed no acute findings.  Because of your risk factors and age we have referred you to cardiology for further workup and management.  Please stay well-hydrated at home. Please follow up with your primary care provider or cardiologist within 1 week.   Do not hesitate to return to the ED or call 911 if you experience: -Worsening symptoms -Chest pain, shortness of breath -Lightheadedness, passing out -Fevers/chills -Anything else that concerns you

## 2023-10-06 NOTE — ED Notes (Signed)
 DC paperwork given and verbally understood.

## 2023-10-06 NOTE — ED Provider Notes (Signed)
 [place] EMERGENCY DEPARTMENT AT [facility] Provider Note   CSN: [id number] Arrival date & time: 10/06/23  1024     History {Add pertinent medical, surgical, social history, OB history to HPI:1} Chief Complaint  Patient presents with  . Chest Pain  . Diarrhea    Amy [name] is a 78 y.o. female with PMH as listed below who presents with sudden onset left-sided chest and left-sided arm pain that began when she woke up this morning.  No history of similar.  Also at 4 AM she developed watery diarrhea and has had greater than 5 episodes since that time nonbloody.  No nausea vomiting, abdominal pain, fever/chills, recent travel, shortness of breath, cough, flulike symptoms, leg swelling, history of heart failure, history of DVT/PE, recent surgery/immobilization/hospitalization.  Does not take any hormones.  She did check her SpO2 at home earlier which was normal..    Past Medical History:  Diagnosis Date  . Belching    Saw Dr. Rosalie in 05/2013 for excessive burping. Was given Prilosec & will add probiotic after 2 weeks.  . DDD (degenerative disc disease)   . Elevated blood pressure reading without diagnosis of hypertension   . Hypertension   . Impaired fasting glucose   . Intermittent low back pain    Right sided, radiating into her legs  . Personal history of colonic polyps 05/28/11   By colon cancer screening. Also found tubular adenomas.  . Plantar fasciitis    Intermittent  . Postnasal drip   . Tachycardia    Problems with tachycardia with negative eval, Dr. Victory Sharps & a normal treadmill exam 03/2001 asymptomatic since that time. Repeat cardiolite stress test 01/2011 - normal.  . Tubular adenoma 05/28/11   By colon cancer screening. Also found hyperplastic polyps.       Home Medications Prior to Admission medications   Medication Sig Start Date End Date Taking? Authorizing Provider  amLODipine (NORVASC) 2.5 MG tablet Take 2.5 mg by mouth daily.    [provider]  b complex vitamins capsule Take 1 capsule by mouth daily.    [provider]  CALCIUM PO Take 500 mg by mouth daily. Take with meals.    [provider]  cholecalciferol (VITAMIN D) 1000 UNITS tablet Take 1,000 Units by mouth daily.    [provider]  Multiple Vitamin (MULTIVITAMIN) capsule Take 1 capsule by mouth daily.    [provider]  Omega-3 Fatty Acids (FISH OIL) 1000 MG CAPS Take 1 capsule by mouth daily. Take with a meal    [provider]      Allergies    Patient has no known allergies.    Review of Systems   Review of Systems A 10 point review of systems was performed and is negative unless otherwise reported in HPI.  Physical Exam Updated Vital Signs BP 132/69   Pulse 86   Resp 17   SpO2 97%  Physical Exam General: Normal appearing female, lying in bed.  HEENT: PERRLA, Sclera anicteric, MMM, trachea midline.  Cardiology: RRR, no murmurs/rubs/gallops. BL radial and DP pulses equal bilaterally.  Resp: Normal respiratory rate and effort. CTAB, no wheezes, rhonchi, crackles.  Abd: Soft, non-tender, non-distended. No rebound tenderness or guarding.  GU: Deferred. MSK: No peripheral edema or signs of trauma. Extremities without deformity or TTP. No cyanosis or clubbing. Skin: warm, dry. No rashes or lesions. Back: No CVA tenderness Neuro: A&Ox4, CNs II-XII grossly intact. MAEs. Sensation grossly intact.  Psych:  Normal mood and affect.   ED Results / Procedures / Treatments   Labs (all labs ordered are listed, but only abnormal results are displayed) Labs Reviewed  BASIC METABOLIC PANEL WITH GFR - Abnormal; Notable for the following components:      Result Value   CO2 21 (*)    Glucose, Bld 123 (*)    All other components within normal limits  LIPASE, BLOOD - Abnormal; Notable for the following components:   Lipase 57 (*)    All other components within normal limits  CBC  HEPATIC FUNCTION PANEL   TROPONIN T, HIGH SENSITIVITY  TROPONIN T, HIGH SENSITIVITY    EKG EKG Interpretation Date/Time:  Monday October 06 2023 10:37:10 EDT Ventricular Rate:  95 PR Interval:  168 QRS Duration:  88 QT Interval:  344 QTC Calculation: 432 R Axis:   78  Text Interpretation: Normal sinus rhythm Nonspecific T wave abnormality Confirmed by Franklyn Gills [phone]) on 10/06/2023 2:25:56 PM  Radiology DG Chest 2 View Result Date: 10/06/2023 CLINICAL DATA:  Chest pain EXAM: CHEST - 2 VIEW COMPARISON:  01/17/2011 FINDINGS: A mild pectus excavatum deformity. Lateral view degraded by patient arm position. Midline trachea. Normal heart size and mediastinal contours. No pleural effusion or pneumothorax. Clear lungs. EKG lead artifact projects over the left upper chest on the frontal. IMPRESSION: No active cardiopulmonary disease. Electronically Signed   By: Rockey Kilts M.D.   On: 10/06/2023 10:53    Procedures Procedures  {Document cardiac monitor, telemetry assessment procedure when appropriate:1}  Medications Ordered in ED Medications - No data to display  ED Course/ Medical Decision Making/ A&P                          Medical Decision Making Amount and/or Complexity of Data Reviewed Labs: ordered. Decision-making details documented in ED Course. Radiology: ordered. Decision-making details documented in ED Course.    This patient presents to the ED for concern of ***, this involves an extensive number of treatment options, and is a complaint that carries with it a high risk of complications and morbidity.  I considered the following differential and admission for this acute, potentially life threatening condition.   MDM:    Patient is overall very well-appearing and at the time of my evaluation in the emergency department she feels much better.  Still feels some soreness in the left chest but nothing like earlier.  EKG does not demonstrate any signs of ischemia or arrhythmia and she had no  palpitations when the episode was severe.  She has no acute findings on the chest x-ray and no symptoms of pneumonia.  No widened mediastinum and she has intact distal pulses in bilateral upper and lower extremities with no tearing chest pain, and I believe that likelihood of aortic dissection is low.  I did discuss with the patient that we did not completely rule out aortic dissection utilizing CT.    Clinical Course as of 10/14/23 1053  Mon Oct 06, 2023  1151 DG Chest 2 View No active cardiopulmonary disease. [HN]  1311 Troponin T High Sensitivity: <15 Neg troponin x2 [HN]  1437 Patient reevaluated and she feels back to her normal self.  She has no remaining arm pain or chest pain.  No diarrhea or abdominal pain.  Labs are very reassuring and her troponin is negative x 2.  Patient has a moderate heart score due to age, history of hypertension, and nonspecific repolarization disturbance on  EKG.  Patient will be referred to cardiology but is very well-appearing and hemodynamically stable.  Believe patient is stable for discharge.  I discussed this with the patient and her daughter at bedside.  Patient is given return precautions and discharge instructions.  All questions answered to their satisfaction. [HN]    Clinical Course User Index [HN] Franklyn Sid SAILOR, MD    Labs: I Ordered, and personally interpreted labs.  The pertinent results include: Those listed above  Imaging Studies ordered: I ordered imaging studies including chest x-ray I independently visualized and interpreted imaging. I agree with the radiologist interpretation  Additional history obtained from chart review, family at bedside.    Cardiac Monitoring: .The patient was maintained on a cardiac monitor.  I personally viewed and interpreted the cardiac monitored which showed an underlying rhythm of: Normal sinus rhythm  Reevaluation: After the interventions noted above, I reevaluated the patient and found that they have  :resolved  Social Determinants of Health: . lives independently  Disposition:  DC w/ discharge instructions/return precautions. All questions answered to patient's satisfaction.    Co morbidities that complicate the patient evaluation . Past Medical History:  Diagnosis Date  . Belching    Saw Dr. Rosalie in 05/2013 for excessive burping. Was given Prilosec & will add probiotic after 2 weeks.  . DDD (degenerative disc disease)   . Elevated blood pressure reading without diagnosis of hypertension   . Hypertension   . Impaired fasting glucose   . Intermittent low back pain    Right sided, radiating into her legs  . Personal history of colonic polyps 05/28/11   By colon cancer screening. Also found tubular adenomas.  . Plantar fasciitis    Intermittent  . Postnasal drip   . Tachycardia    Problems with tachycardia with negative eval, Dr. Victory Sharps & a normal treadmill exam 03/2001 asymptomatic since that time. Repeat cardiolite stress test 01/2011 - normal.  . Tubular adenoma 05/28/11   By colon cancer screening. Also found hyperplastic polyps.     Medicines No orders of the defined types were placed in this encounter.   I have reviewed the patients home medicines and have made adjustments as needed  Problem List / ED Course: Problem List Items Addressed This Visit   None Visit Diagnoses       Left upper arm pain    -  Primary   Relevant Orders   Ambulatory referral to Cardiology     Diarrhea, unspecified type                {Document critical care time when appropriate:1} {Document review of labs and clinical decision tools ie heart score, Chads2Vasc2 etc:1}  {Document your independent review of radiology images, and any outside records:1} {Document your discussion with family members, caretakers, and with consultants:1} {Document social determinants of health affecting pt's care:1} {Document your decision making why or why not admission, treatments were  needed:1}  This note was created using dictation software, which may contain spelling or grammatical errors.

## 2023-10-06 NOTE — ED Triage Notes (Signed)
 Pt to ED reporting sudden onset of left sided chest pain and left sided arm pain and diarrhea. Patient reporting all symptoms started at 4am. No fevers, Nausea or vomiting. No shortness of breath and patient reports checking SpO2 at home which was normal.

## 2023-10-13 DIAGNOSIS — M79602 Pain in left arm: Secondary | ICD-10-CM | POA: Diagnosis not present

## 2023-10-13 DIAGNOSIS — H6122 Impacted cerumen, left ear: Secondary | ICD-10-CM | POA: Diagnosis not present

## 2023-10-13 DIAGNOSIS — I10 Essential (primary) hypertension: Secondary | ICD-10-CM | POA: Diagnosis not present

## 2023-10-15 ENCOUNTER — Other Ambulatory Visit (HOSPITAL_BASED_OUTPATIENT_CLINIC_OR_DEPARTMENT_OTHER): Payer: Self-pay | Admitting: Family Medicine

## 2023-10-15 DIAGNOSIS — I10 Essential (primary) hypertension: Secondary | ICD-10-CM

## 2023-10-23 DIAGNOSIS — I10 Essential (primary) hypertension: Secondary | ICD-10-CM | POA: Diagnosis not present

## 2023-10-23 DIAGNOSIS — E782 Mixed hyperlipidemia: Secondary | ICD-10-CM | POA: Diagnosis not present

## 2023-10-27 ENCOUNTER — Other Ambulatory Visit: Payer: Self-pay | Admitting: Family Medicine

## 2023-10-27 DIAGNOSIS — Z1231 Encounter for screening mammogram for malignant neoplasm of breast: Secondary | ICD-10-CM

## 2023-11-11 ENCOUNTER — Ambulatory Visit

## 2023-11-23 DIAGNOSIS — E782 Mixed hyperlipidemia: Secondary | ICD-10-CM | POA: Diagnosis not present

## 2023-11-23 DIAGNOSIS — I10 Essential (primary) hypertension: Secondary | ICD-10-CM | POA: Diagnosis not present

## 2023-12-01 ENCOUNTER — Ambulatory Visit

## 2023-12-15 ENCOUNTER — Ambulatory Visit
Admission: RE | Admit: 2023-12-15 | Discharge: 2023-12-15 | Disposition: A | Discharge status: Home / Self Care | Source: Ambulatory Visit | Attending: Family Medicine | Admitting: Family Medicine

## 2023-12-15 DIAGNOSIS — Z1231 Encounter for screening mammogram for malignant neoplasm of breast: Secondary | ICD-10-CM

## 2023-12-23 DIAGNOSIS — I10 Essential (primary) hypertension: Secondary | ICD-10-CM | POA: Diagnosis not present

## 2023-12-23 DIAGNOSIS — E782 Mixed hyperlipidemia: Secondary | ICD-10-CM | POA: Diagnosis not present

## 2023-12-29 ENCOUNTER — Ambulatory Visit (HOSPITAL_BASED_OUTPATIENT_CLINIC_OR_DEPARTMENT_OTHER): Admitting: Cardiology

## 2024-02-12 ENCOUNTER — Encounter (HOSPITAL_BASED_OUTPATIENT_CLINIC_OR_DEPARTMENT_OTHER): Payer: Self-pay | Admitting: Cardiology

## 2024-02-12 ENCOUNTER — Ambulatory Visit (HOSPITAL_BASED_OUTPATIENT_CLINIC_OR_DEPARTMENT_OTHER): Admitting: Cardiology

## 2024-02-12 VITALS — BP 146/72 | HR 80 | Ht 65.0 in | Wt 166.0 lb

## 2024-02-12 DIAGNOSIS — I10 Essential (primary) hypertension: Secondary | ICD-10-CM

## 2024-02-12 DIAGNOSIS — Z7189 Other specified counseling: Secondary | ICD-10-CM

## 2024-02-12 DIAGNOSIS — Z712 Person consulting for explanation of examination or test findings: Secondary | ICD-10-CM

## 2024-02-12 DIAGNOSIS — R079 Chest pain, unspecified: Secondary | ICD-10-CM | POA: Diagnosis not present

## 2024-02-12 NOTE — Patient Instructions (Signed)

## 2024-02-12 NOTE — Progress Notes (Signed)
 Cardiology Office Note:  .   Date:  02/12/2024  ID:  Amy [name], DOB Mar 12, 1946, MRN [id number] PCP: Amy Harvey, MD  [facility] Providers Cardiologist:  Amy Bruckner, MD {  History of Present Illness: Amy   [name] is a 78 y.o. female with PMH hypertension, GERD.   Referral from 10/06/23 reviewed. She was referred by ER physician Dr. Franklyn after presenting with sudden left sided chest and arm pain. Also noted to have multiple episodes of watery diarrhea. HsTn, ECG unremarkable. Noted to have soreness in the area. Symptoms resolved without treatment. Recommended to follow up with cardiology for further evaluation.  Has had episodes of chest pain in the past requiring ER evaluation, has always told it wasn't her heart  Notes that recently she has had tightness in her chest and back, feeling like her bra is too tight. Happens about twice a month, nonexertional. Lasts about 15 minutes. No associated symptoms. Nothing makes it worse, better if she sits down. Not sure if it is related to food.   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  -Prior cardiac history: none -Prior workup/treatment: only echo in 2012.  -Alcohol: never -Tobacco: never -Comorbidities: Hypertension. Denies high cholesterol, diabetes, kidney disease, long term inflammation -Family history:  both of her parents had heart issues (specifics unclear). Father had open heart surgery  ROS: Denies shortness of breath at rest or with normal exertion. No PND, orthopnea, LE edema or unexpected weight gain. No syncope or palpitations. ROS otherwise negative except as noted.   Studies Reviewed: Amy    EKG:  EKG Interpretation Date/Time:  Thursday February 12 2024 14:46:04 EST Ventricular Rate:  80 PR Interval:  178 QRS Duration:  86 QT Interval:  380 QTC Calculation: 438 R Axis:   53  Text Interpretation: Normal sinus rhythm Normal ECG Confirmed by [name] Amy [phone])  on 02/12/2024 3:10:04 PM    Physical Exam:   VS:  BP (!) 146/72 (BP Location: Right Arm, Patient Position: Sitting, Cuff Size: Normal)   Pulse 80   Ht 5' 5 (1.651 m)   Wt 166 lb (75.3 kg)   SpO2 95%   BMI 27.62 kg/m    Wt Readings from Last 3 Encounters:  02/12/24 166 lb (75.3 kg)  04/28/18 175 lb (79.4 kg)  05/18/14 168 lb (76.2 kg)    GEN: Well nourished, well developed in no acute distress HEENT: Normal, moist mucous membranes NECK: No JVD CARDIAC: regular rhythm, normal S1 and S2, no rubs or gallops. No murmur. VASCULAR: Radial and DP pulses 2+ bilaterally. No carotid bruits RESPIRATORY:  Clear to auscultation without rales, wheezing or rhonchi  ABDOMEN: Soft, non-tender, non-distended MUSCULOSKELETAL:  Ambulates independently SKIN: Warm and dry, no edema NEUROLOGIC:  Alert and oriented x 3. No focal neuro deficits noted. PSYCHIATRIC:  Normal affect    ASSESSMENT AND PLAN: .    Chest and back pain -reviewed results from ER workup, reassuring -risk factor of hypertension. -lipids from [facility] 05/27/23: Tchol 207, HDL 79, LDL 115, TG 75 -discussed treadmill stress, nuclear stress/lexiscan, and CT coronary angiography. Discussed pros and cons of each, including but not limited to false positive/false negative risk, radiation risk, and risk of IV contrast dye. Based on shared decision making, she will hold on testing for now and monitor symptoms. If symptoms persist, worsen, or change, then we will pursue CT coronary. She will contact me with changes.  -reviewed red flag warning signs that need immediate  medical attention   Hypertension -only on low dose amlodipine -she is under stress as a caregiver -discussed home BP monitoring  CV risk counseling and prevention -recommend heart healthy/Mediterranean diet, with whole grains, fruits, vegetable, fish, lean meats, nuts, and olive oil. Limit salt. -recommend moderate walking, 3-5 times/week for 30-50 minutes each session. Aim for  at least 150 minutes/week. Goal should be pace of 3 miles/hours, or walking 1.5 miles in 30 minutes -recommend avoidance of tobacco products. Avoid excess alcohol.  Dispo: She will contact me with any changes or concerns, and I will see her back as needed  Signed, Amy Bruckner, MD   Amy Bruckner, MD, PhD, [facility] [employer]  [facility]  [place]  [facility] at [facility] at [facility] [address] [place], [place] [address] [phone]

## 2024-03-05 ENCOUNTER — Other Ambulatory Visit: Payer: Self-pay | Admitting: Orthopedic Surgery

## 2024-03-05 DIAGNOSIS — M4800 Spinal stenosis, site unspecified: Secondary | ICD-10-CM

## 2024-04-03 ENCOUNTER — Ambulatory Visit
Admission: RE | Admit: 2024-04-03 | Discharge: 2024-04-03 | Disposition: A | Source: Ambulatory Visit | Attending: Orthopedic Surgery

## 2024-04-03 DIAGNOSIS — M4800 Spinal stenosis, site unspecified: Secondary | ICD-10-CM

## 2024-04-03 MED ORDER — GADOPICLENOL 0.5 MMOL/ML IV SOLN
7.5000 mL | Freq: Once | INTRAVENOUS | Status: AC | PRN
  Administered 2024-04-03: 7.5 mL via INTRAVENOUS
